# Patient Record
Sex: Male | Born: 1945 | Race: White | Hispanic: No | Marital: Single | Smoking: Former smoker
Health system: Southern US, Community
[De-identification: ages and names within clinical notes are randomized; demographics above are authoritative.]

## PROBLEM LIST (undated history)

## (undated) DIAGNOSIS — M25551 Pain in right hip: Secondary | ICD-10-CM

## (undated) DIAGNOSIS — G40909 Epilepsy, unspecified, not intractable, without status epilepticus: Secondary | ICD-10-CM

## (undated) DIAGNOSIS — R413 Other amnesia: Secondary | ICD-10-CM

## (undated) DIAGNOSIS — D619 Aplastic anemia, unspecified: Secondary | ICD-10-CM

## (undated) DIAGNOSIS — R251 Tremor, unspecified: Secondary | ICD-10-CM

## (undated) HISTORY — DX: Tremor, unspecified: R25.1

## (undated) HISTORY — DX: Other amnesia: R41.3

## (undated) HISTORY — DX: Pain in right hip: M25.551

## (undated) HISTORY — DX: Epilepsy, unspecified, not intractable, without status epilepticus: G40.909

## (undated) HISTORY — DX: Aplastic anemia, unspecified: D61.9

## (undated) HISTORY — PX: CRANIOTOMY: SHX93

## (undated) HISTORY — PX: CATARACT EXTRACTION: SUR2

---

## 1998-01-23 ENCOUNTER — Emergency Department (HOSPITAL_COMMUNITY): Admission: EM | Admit: 1998-01-23 | Discharge: 1998-01-23 | Payer: Self-pay | Admitting: Emergency Medicine

## 1998-01-23 ENCOUNTER — Encounter: Payer: Self-pay | Admitting: Emergency Medicine

## 2003-06-04 ENCOUNTER — Ambulatory Visit (HOSPITAL_COMMUNITY): Admission: RE | Admit: 2003-06-04 | Discharge: 2003-06-04 | Payer: Self-pay | Admitting: Family Medicine

## 2003-10-22 ENCOUNTER — Ambulatory Visit: Payer: Self-pay | Admitting: *Deleted

## 2003-12-23 ENCOUNTER — Ambulatory Visit (HOSPITAL_COMMUNITY): Admission: RE | Admit: 2003-12-23 | Discharge: 2003-12-23 | Payer: Self-pay | Admitting: Family Medicine

## 2003-12-23 ENCOUNTER — Ambulatory Visit: Payer: Self-pay | Admitting: Internal Medicine

## 2003-12-24 ENCOUNTER — Ambulatory Visit: Payer: Self-pay | Admitting: Family Medicine

## 2004-05-19 ENCOUNTER — Ambulatory Visit: Payer: Self-pay | Admitting: Family Medicine

## 2005-02-15 ENCOUNTER — Ambulatory Visit (HOSPITAL_COMMUNITY): Admission: RE | Admit: 2005-02-15 | Discharge: 2005-02-15 | Payer: Self-pay | Admitting: Neurology

## 2006-02-03 IMAGING — CT CT HEAD W/O CM
1 series · 15 of 30 positions shown, 19 images · non-contrast
Comparison: None.

CLINICAL DATA: 58-year-old male, fall, seizure activity, scalp hematoma on the right.  
CT HEAD WITHOUT CONTRAST, 12/23/03:
TECHNIQUE: Noncontrast axial head CT was performed. 
Diffuse advanced cerebral and cerebellar atrophy with prominence of the CSF spaces and the sulcal pattern is noted.  Mild ventricular enlargement.  No definite acute sulcal effacement, mass effect, edema, hemorrhage, herniation, hydrocephalus, midline shift, or extra-axial fluid collection.  The ventricles are symmetric.  The cisterns are patent.  Right frontal orbital soft tissue swelling is noted.  No underlying fracture.  The visualized nasal bones are intact.  Right maxillary sinus rounded soft tissue mucosal thickening is noted consistent with retention cyst or polyp measuring 2 cm.  Remainder of the mastoids and sinuses visualized are clear.

[Series 2: brain · axial · 0.47mm/px · z∈[+101,+296]mm · 15 of 46 slices shown, 19 images]
[im 2/46  brain]
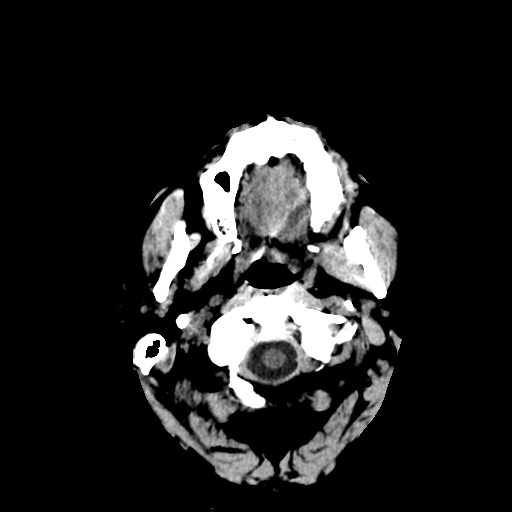
[im 2/46  bone]
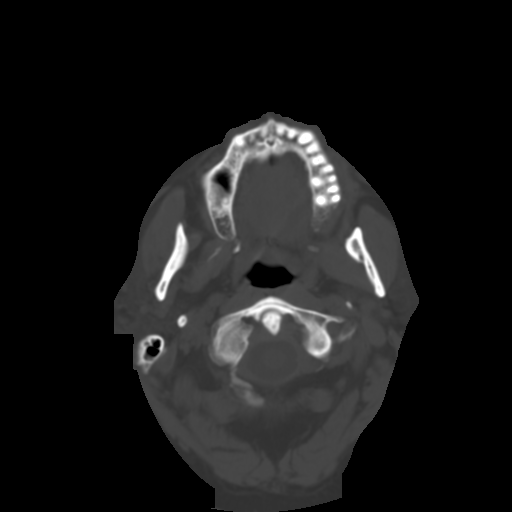
[im 5/46  brain]
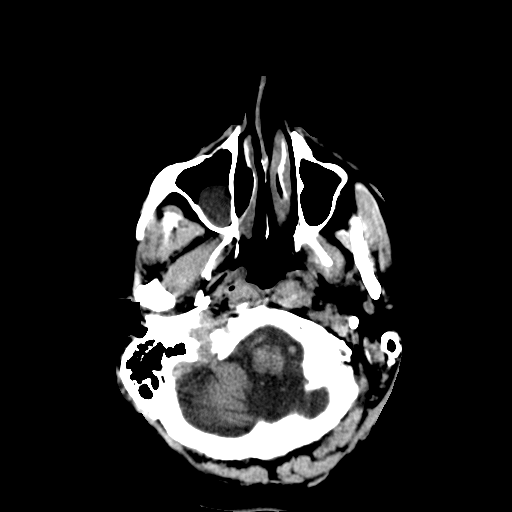
[im 8/46  brain]
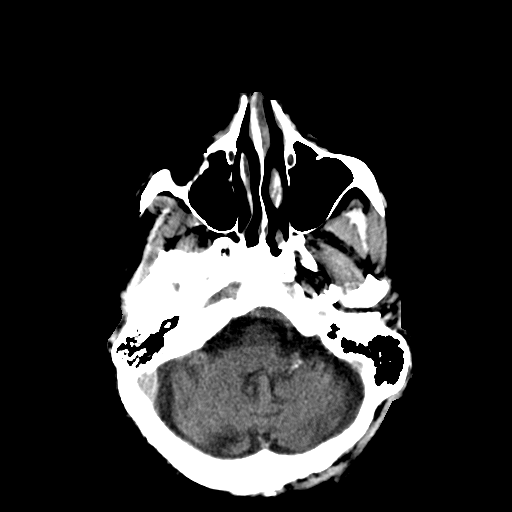
[im 11/46  brain]
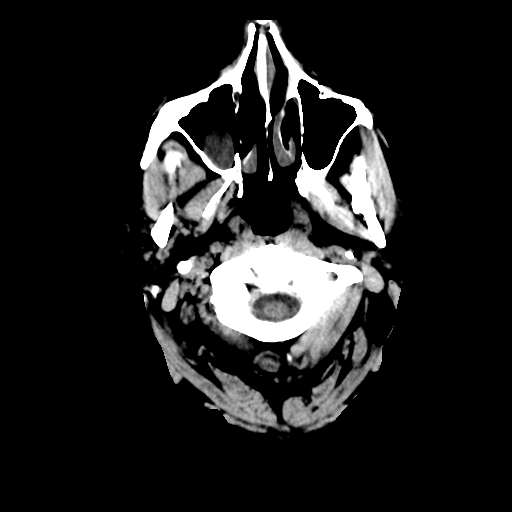
[im 14/46  brain]
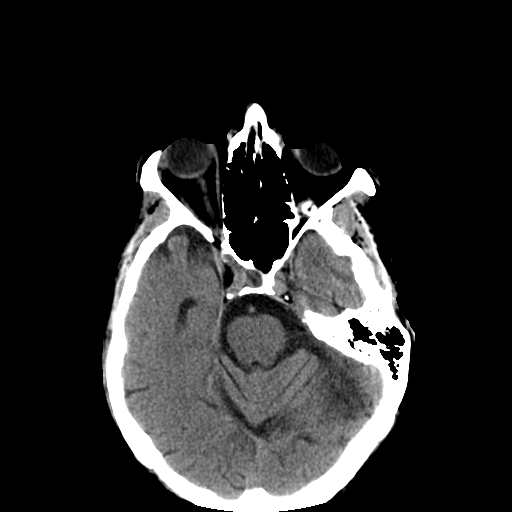
[im 14/46  bone]
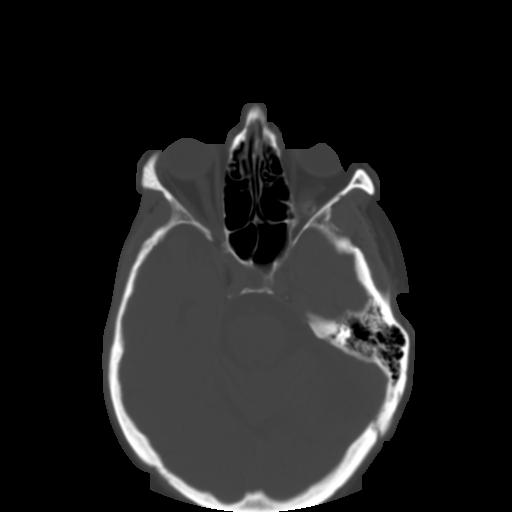
[im 18/46  brain]
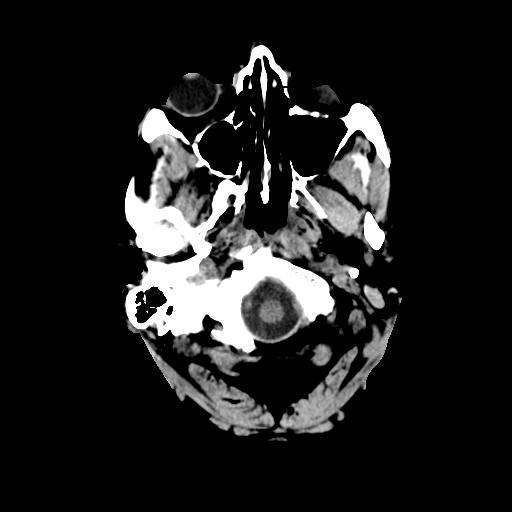
[im 21/46  brain]
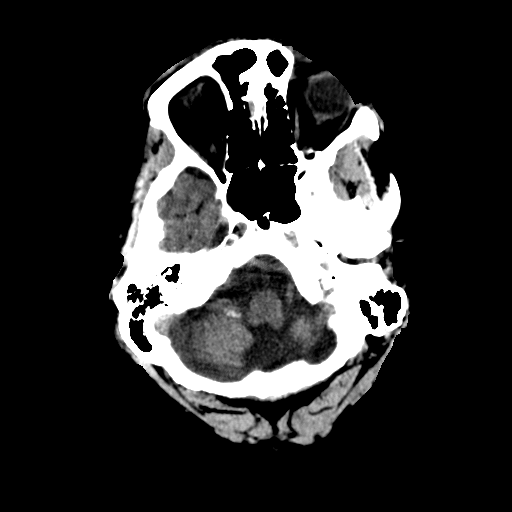
[im 24/46  brain]
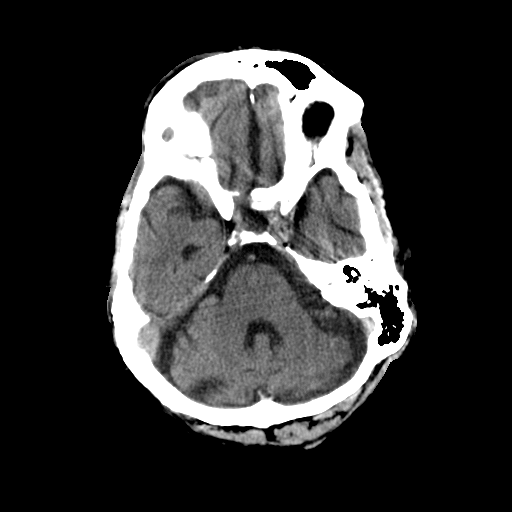
[im 25/46  brain]
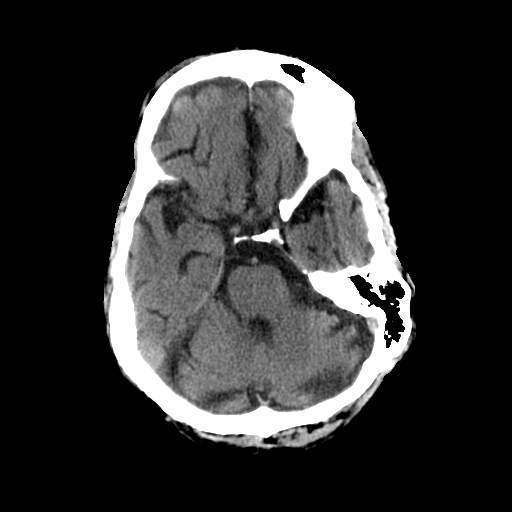
[im 25/46  bone]
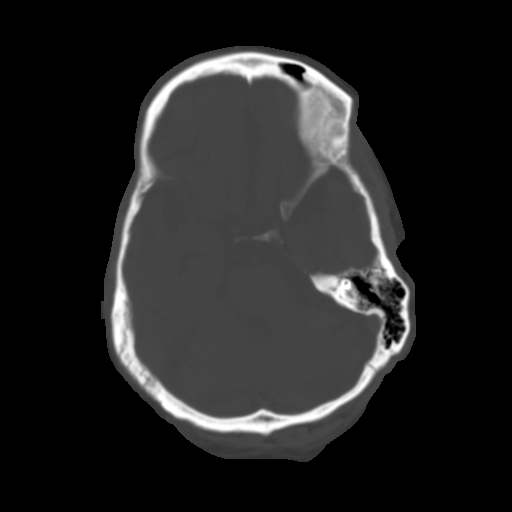
[im 28/46  brain]
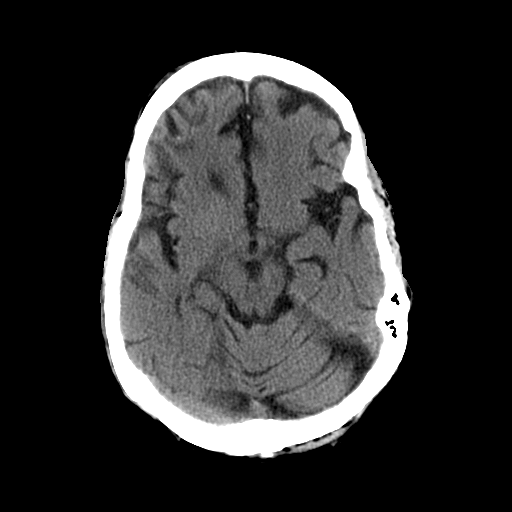
[im 32/46  brain]
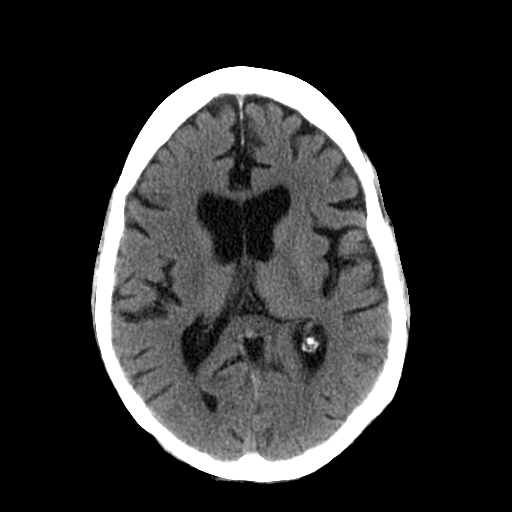
[im 35/46  brain]
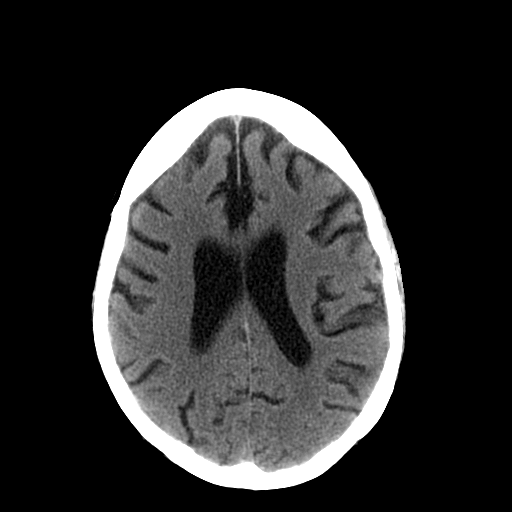
[im 38/46  brain]
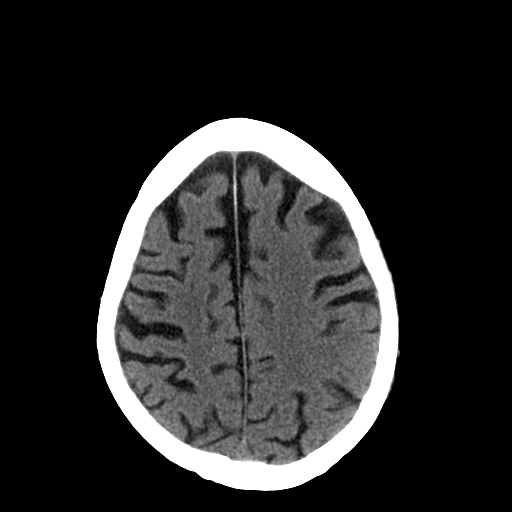
[im 38/46  bone]
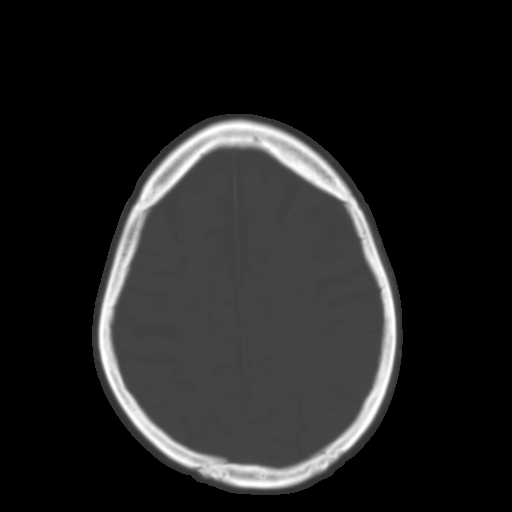
[im 41/46  brain]
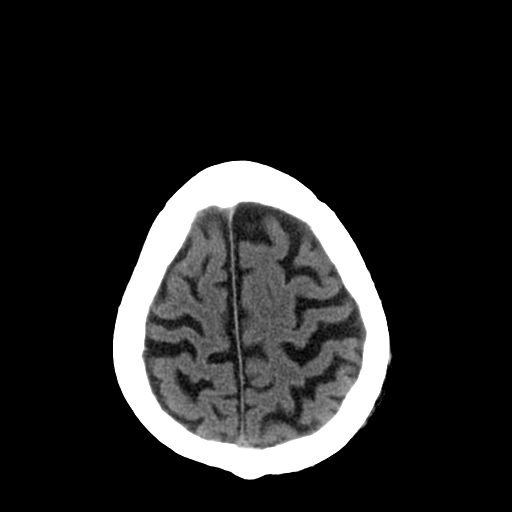
[im 44/46  brain]
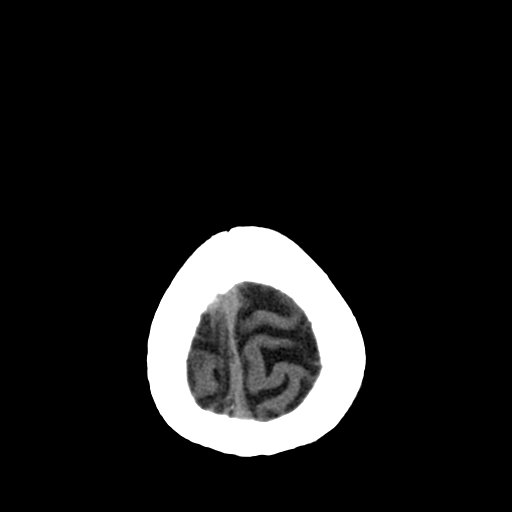

[15 of 30 positions shown; findings below may reference images not displayed]

IMPRESSION: 1. Advanced diffuse brain atrophy.  No acute intracranial hemorrhage or mass effect.  
2. Right frontal orbital soft tissue bruising and swelling.  No underlying fracture.  
3. Right maxillary sinus retention cyst versus polyp.

## 2007-03-30 IMAGING — CT CT HEAD W/O CM
1 of 2 series · 13 of 30 positions shown, 17 images · IV contrast (agent unspecified)
Comparison: 12/23/2003.

CLINICAL DATA: Increasing seizure activity and confusion.  
 HEAD CT WITHOUT CONTRAST:
TECHNIQUE: Contiguous axial images were obtained from the base of the skull through the vertex according to standard protocol without contrast.

[Series 2: brain · axial · 0.47mm/px · z∈[+173,+305]mm · 13 of 28 slices shown, 17 images]
[im 2/28  brain]
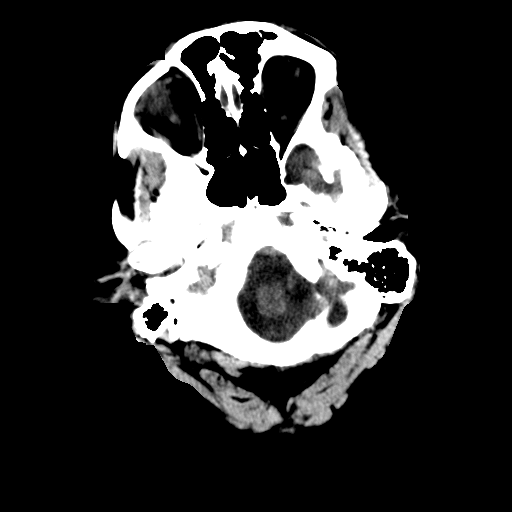
[im 2/28  bone]
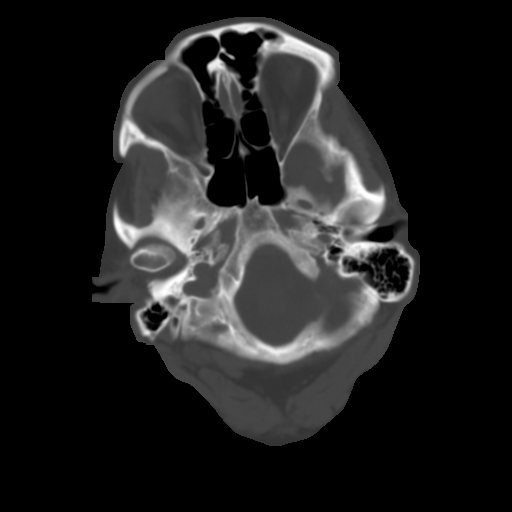
[im 4/28  brain]
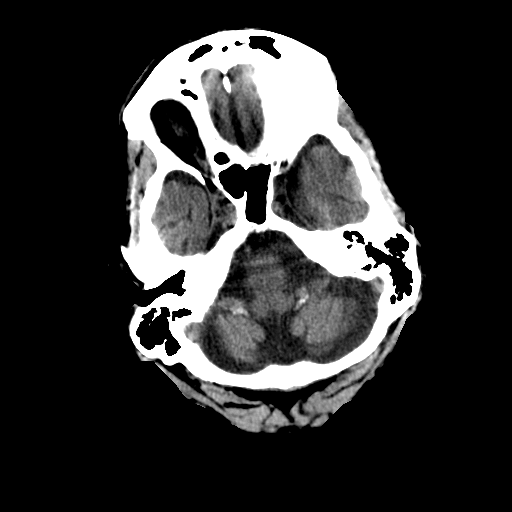
[im 6/28  brain]
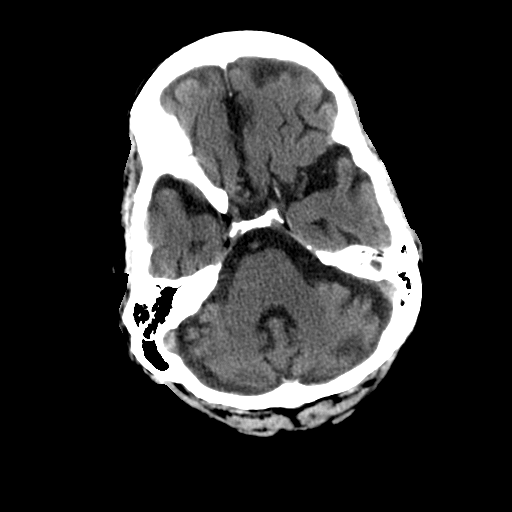
[im 8/28  brain]
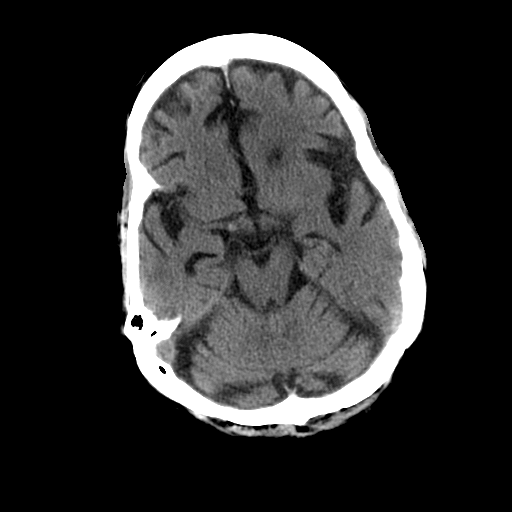
[im 10/28  brain]
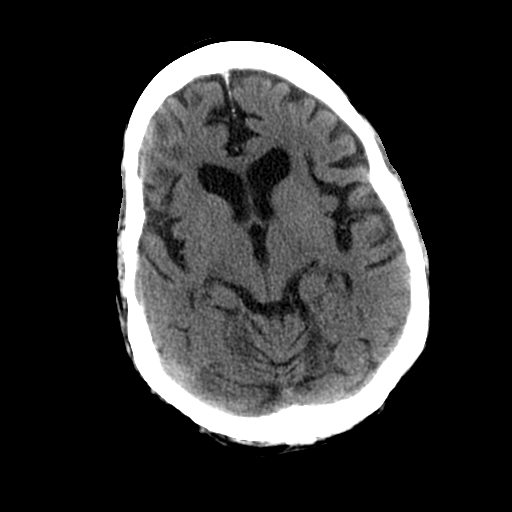
[im 10/28  bone]
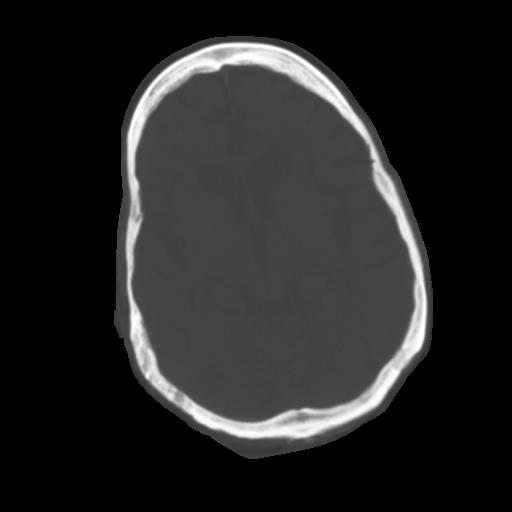
[im 12/28  brain]
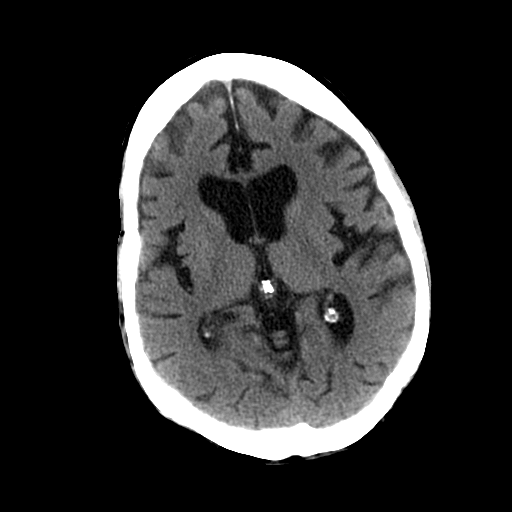
[im 14/28  brain]
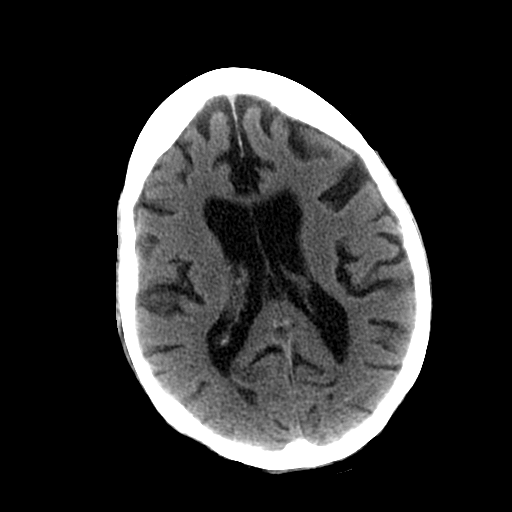
[im 16/28  brain]
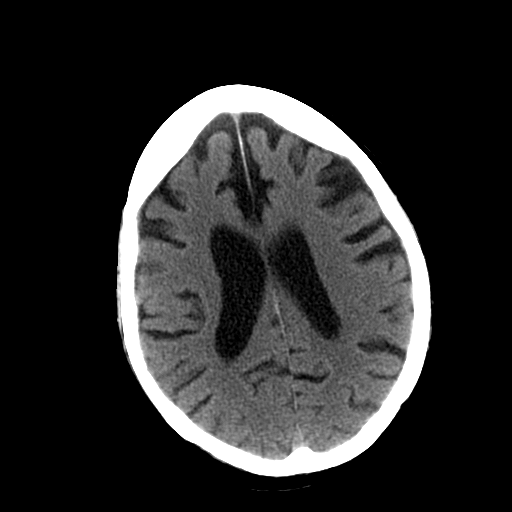
[im 18/28  brain]
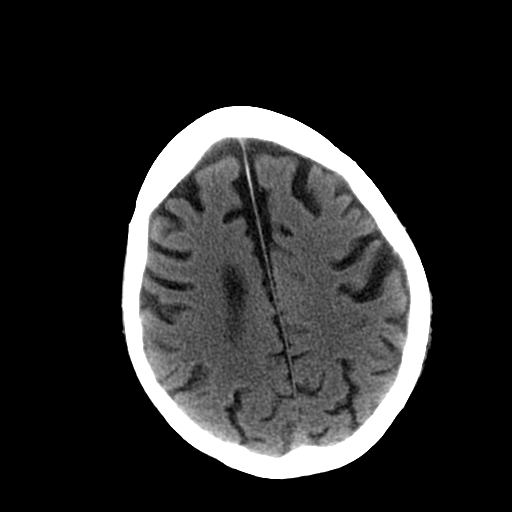
[im 18/28  bone]
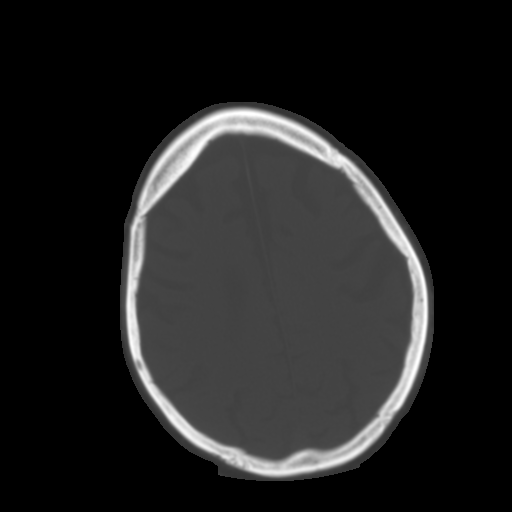
[im 20/28  brain]
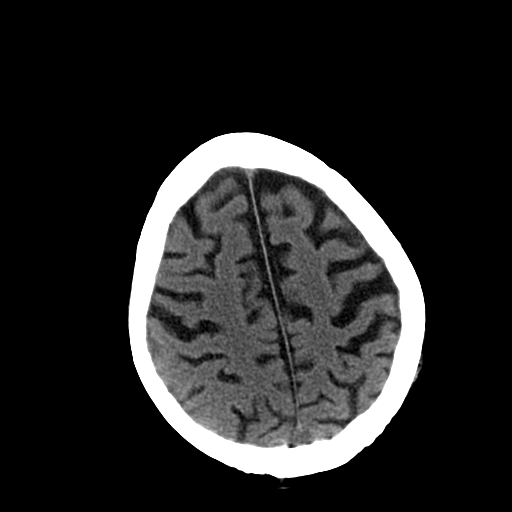
[im 22/28  brain]
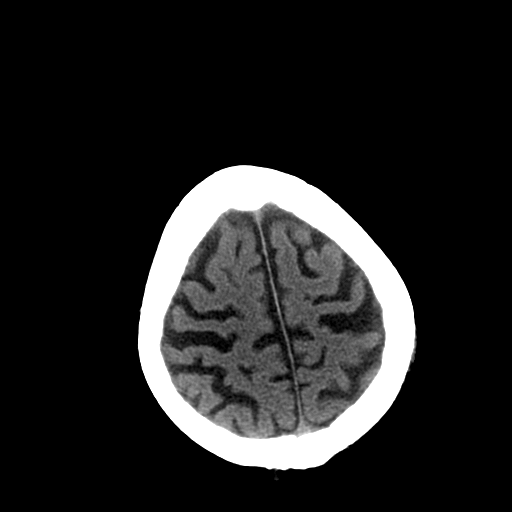
[im 24/28  brain]
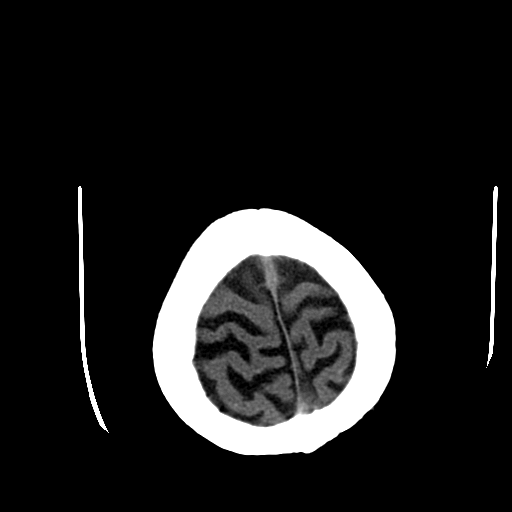
[im 26/28  brain]
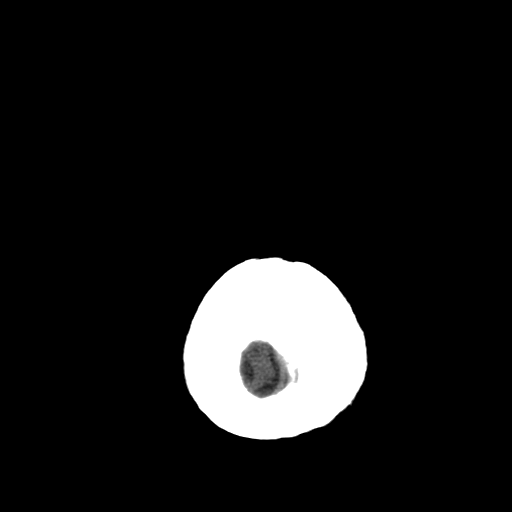
[im 26/28  bone]
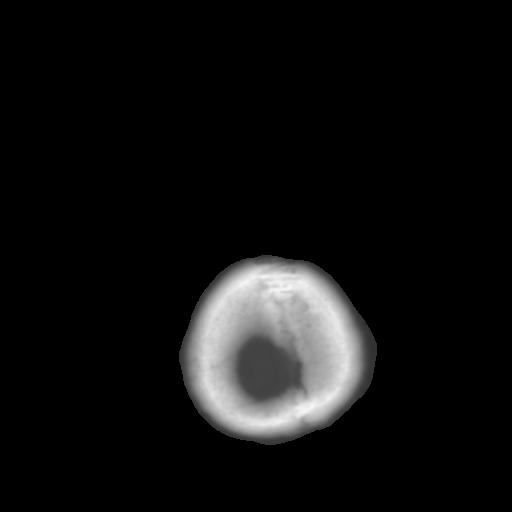

[13 of 30 positions shown; findings below may reference images not displayed]

FINDINGS: Advanced atrophy for age.  No significant small vessel ischemic change.  No definite acute infarct or bleed. Calvarium intact.
IMPRESSION: Advanced atrophy for age ? no acute or focal abnormality.

## 2012-06-04 ENCOUNTER — Ambulatory Visit (INDEPENDENT_AMBULATORY_CARE_PROVIDER_SITE_OTHER): Payer: Medicare Other | Admitting: Neurology

## 2012-06-04 ENCOUNTER — Encounter: Payer: Self-pay | Admitting: Neurology

## 2012-06-04 VITALS — BP 116/78 | HR 76 | Temp 98.0°F | Ht 66.0 in | Wt 186.0 lb

## 2012-06-04 DIAGNOSIS — M25551 Pain in right hip: Secondary | ICD-10-CM

## 2012-06-04 DIAGNOSIS — G40909 Epilepsy, unspecified, not intractable, without status epilepticus: Secondary | ICD-10-CM | POA: Insufficient documentation

## 2012-06-04 DIAGNOSIS — R413 Other amnesia: Secondary | ICD-10-CM

## 2012-06-04 DIAGNOSIS — M25559 Pain in unspecified hip: Secondary | ICD-10-CM

## 2012-06-04 HISTORY — DX: Epilepsy, unspecified, not intractable, without status epilepticus: G40.909

## 2012-06-04 HISTORY — DX: Other amnesia: R41.3

## 2012-06-04 HISTORY — DX: Pain in right hip: M25.551

## 2012-06-04 NOTE — Progress Notes (Signed)
Subjective:    Patient ID: Godson Pollan is a 67 y.o. male.  HPI Interim history:   [name] is a 67 year old right-handed gentleman who presents for followup consultation of his memory loss and seizures. He is accompanied by his sister today. He is a former patient of Dr. Fayrene Fearing love and has been seeing him for several years. He was last seen by him on 03/05/2012, at which time Dr. love did blood work and a urinalysis for recurrent confusional state. The patient's fall assessment total score at that time was 21. 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 He's currently on Depakote 250 mg tid, desmopressin 0.2 mg 3 tablets at night, Synthroid 100 mcg daily, Avodart, Vimpat 150 mg twice daily, Contigen, Robitussin, Celexa generic 10 mg daily, baby aspirin, Fosamax once weekly. According to his sister, the last witnessed seizure was in 8/10. After he has been on Vimpat, he has done well. He fell and hit his head in 2004 and has had problems with his memory. Paternal aunt had Sz. He had a "difficult delivery". Sz started at age 41. He has R hip pain and needs a R hip replacement, but needs to stop smoking first. He has had LE swelling for about 3 years. He has had echo, LE Doppler and EKG last year, per sister. He has been wearing LE compression hoses.   I reviewed Dr. Imagene Gurney prior notes and the patient's records and below is a summary of that review:  67 yo RH gentleman who resides at [facility] in [place]. 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 He was started on vimpat in January 2010 but had side effects including sleepiness. He has a history of aplastic anemia. In August 2012 his MMSE was 26, clock drawing was 4, animal fluency was 7. In  January last year he slipped off his bed and was noted to be confused and may have had a seizure. In January of this year he was found having fallen there has been and was suspected to have had a seizure at the time. He was confused. In January 2014 his MMSE was 20, clock drawing was 4, animal fluency was 5.   His Past Medical History Is Significant For: Past Medical History  Diagnosis Date  . Aplastic anemia   . Tremor   . Epilepsy     His Past Surgical History Is Significant For: Past Surgical History  Procedure Laterality Date  . Craniotomy Right     frontal  . Cataract extraction      His Family History Is Significant For: History reviewed. No pertinent family history.  His Social History Is Significant For: History   Social History  . Marital Status: Single    Spouse Name: N/A    Number of Children: N/A  . Years of Education: N/A   Social History Main Topics  . Smoking status: Current Every Day Smoker    Types: Cigarettes  . Smokeless tobacco: None  . Alcohol Use: No  . Drug Use: No  . Sexually Active: None   Other Topics Concern  . None   Social History Narrative  . None    His Allergies Are:  Allergies  Allergen Reactions  . Aricept (Donepezil Hcl)   . Hydrocodone   . Ibuprofen   .  Mesantoin (Mephenytoin)   . Tegretol (Carbamazepine)   . Topamax (Topiramate)   . Zonegran (Zonisamide)   :   His Current Medications Are:  Outpatient Encounter Prescriptions as of 06/04/2012  Medication Sig Dispense Refill  . alendronate (FOSAMAX) 70 MG tablet       . AVODART 0.5 MG capsule       . citalopram (CELEXA) 10 MG tablet       . COMBIGAN 0.2-0.5 % ophthalmic solution       . desmopressin (DDAVP) 0.2 MG tablet       . divalproex (DEPAKOTE) 250 MG DR tablet       . latanoprost (XALATAN) 0.005 % ophthalmic solution       . levothyroxine (SYNTHROID, LEVOTHROID) 100 MCG tablet       . loperamide (IMODIUM) 2 MG capsule       . nystatin cream (MYCOSTATIN)        . tamsulosin (FLOMAX) 0.4 MG CAPS       . VIMPAT 150 MG TABS        No facility-administered encounter medications on file as of 06/04/2012.  :  Review of Systems  Constitutional: Positive for fatigue.  Respiratory:       Snoring  Gastrointestinal: Positive for constipation.  Musculoskeletal: Positive for myalgias and arthralgias.  Skin: Positive for rash.  Neurological: Positive for dizziness, syncope and speech difficulty.       Memory loss  Psychiatric/Behavioral: Positive for confusion.       Too much sleep    Objective:  Neurologic Exam  Physical Exam Physical Examination:   Filed Vitals:   06/04/12 1201  BP: 116/78  Pulse: 76  Temp: 98 F (36.7 C)    General Examination: The patient is a very pleasant 67 y.o. male in no acute distress.  HEENT: Normocephalic, atraumatic, pupils are equal, round and reactive to light and accommodation. Funduscopic exam is normal with sharp disc margins noted. Extraocular tracking is good without nystagmus noted. Normal smooth pursuit is noted. Hearing is grossly intact. Tympanic membranes are clear bilaterally. Face is symmetric with normal facial animation and normal facial sensation. Speech is clear with no dysarthria noted. There is no hypophonia. There is no lip, neck or jaw tremor. Neck is supple with full range of motion. There are no carotid bruits on auscultation. Oropharynx exam reveals normal findings. No significant airway crowding is noted. Mallampati is class II. Tongue protrudes centrally and palate elevates symmetrically.   Chest: is clear to auscultation without wheezing, rhonchi or crackles noted.  Heart: sounds are regular and normal without murmurs, rubs or gallops noted.   Abdomen: is soft, non-tender and non-distended with normal bowel sounds appreciated on auscultation.  Extremities: There is 2+ pitting edema in the distal lower extremities bilaterally up to the knees and he is wearing compression socks, knee  highs   Skin: is warm and dry with no trophic changes noted.  Musculoskeletal: exam reveals no obvious joint deformities, tenderness or joint swelling or erythema.  Neurologically:  Mental status: The patient is awake, alert and oriented in all 4 spheres. His memory, attention, language and knowledge are fair, his sister provides most of the history. There is no aphasia, agnosia, apraxia or anomia. Speech has mild hesitancy but not dysarthric. Thought process is slightly slowed. Mood is not depressed and affect is normal.  Cranial nerves are as described above under HEENT exam. In addition, shoulder shrug is normal with equal shoulder height noted. Motor exam: Normal bulk, strength  and tone is noted. There is no drift, tremor or rebound. Romberg is negative. Reflexes are 2+ in the UEs and trace in the LEs. Fine motor skills are mildly impaired.  Cerebellar testing shows no dysmetria or intention tremor. There is no truncal or gait ataxia.  Sensory exam is decreased in the LEs.   Gait, station and balance: He stands up with mild difficulty and uses a cane for systems as well. He walks with a limp on the right and reports right hip pain. He's not able to do tandem walk or pull himself up on his heels or toes.               Assessment and Plan:   Assessment and Plan:  In summary, Dewane Timson is a very pleasant 67 y.o.-year old male with a history of Epilepsy since age 26. He is stable in terms of his seizures but has had episodes of confusion, which recently have not recurred. He has had some falls. Part of his gait disorder is related to his right hip pain. He is advised to use his cane at all times. I did not suggest any medication changes today but did suggest followup with Dr. Vickey Huger in 4 month, since she has expertise in epilepsy management. The patient and his sister Alvino Chapel) were in agreement.

## 2012-06-04 NOTE — Patient Instructions (Addendum)
I would like for you to see Dr. Vickey Huger, since she has expertise in epilepsy management.   I do have some generic suggestions for you today:  Please make sure that you drink plenty of fluids. I would like for you to exercise daily for example in the form of walking 20-30 minutes every day, if you can. Please keep a regular sleep-wake schedule, keep regular meal times, do not skip any meals, eat  healthy snacks in between meals, such as fruit or nuts. Try to eat protein with every meal.   Engage in social activities in your community and with your family and try to keep up with current events by reading the newspaper or watching the news.  I do not think we need to make any changes in your medications at this point. I think you're stable enough that we can see you back in 4 months, sooner if we need to. Please call us if you have any interim questions, concerns, or problems or updates to need to discuss.   Brett Canales is my clinical assistant and will answer any of your questions and relay your messages to me and will give you my messages.   Our phone number is [phone]. We also have an after hours call service for urgent matters and there is a physician on-call for urgent questions. For any emergencies you know to call 911 or go to the nearest emergency room.

## 2012-09-19 ENCOUNTER — Encounter: Payer: Self-pay | Admitting: Neurology

## 2012-09-19 ENCOUNTER — Ambulatory Visit (INDEPENDENT_AMBULATORY_CARE_PROVIDER_SITE_OTHER): Payer: Medicare Other | Admitting: Neurology

## 2012-09-19 VITALS — BP 118/73 | HR 77 | Resp 17 | Ht 66.0 in | Wt 180.0 lb

## 2012-09-19 DIAGNOSIS — Q282 Arteriovenous malformation of cerebral vessels: Secondary | ICD-10-CM

## 2012-09-19 DIAGNOSIS — Q283 Other malformations of cerebral vessels: Secondary | ICD-10-CM

## 2012-09-19 DIAGNOSIS — G40802 Other epilepsy, not intractable, without status epilepticus: Secondary | ICD-10-CM

## 2012-09-19 MED ORDER — DIVALPROEX SODIUM 250 MG PO DR TAB: 125.0000 mg | DELAYED_RELEASE_TABLET | Freq: Two times a day (BID) | ORAL | Status: DC

## 2012-09-19 MED ORDER — LACOSAMIDE 150 MG PO TABS: 150.0000 mg | ORAL_TABLET | Freq: Two times a day (BID) | ORAL | Status: DC

## 2012-09-19 NOTE — Progress Notes (Signed)
[facility]  Provider:  Melvyn Novas, M D  Referring Provider: Florentina Jenny, MD Primary Care Physician:  Florentina Jenny, MD  Chief Complaint  Patient presents with  . Follow-up    transfer of care(Love),per Dr Frances Furbish, dementia, rm 10    HPI:  Jordan Thomas is a 67 y.o. male  Is seen here as a referral/ revisit  from [facility] - and has been seen once by Dr Frances Furbish here at [facility] .  Following below is Dr Teofilo Pod note from April 2014.  He's currently on Depakote 250 mg tid, desmopressin 0.2 mg 3 tablets at night, Synthroid 100 mcg daily, Avodart, Vimpat 150 mg twice daily, Contigen, Robitussin, Celexa generic 10 mg daily, baby aspirin, Fosamax once weekly. According to his sister, the last witnessed seizure was in 8/10. After he has been on Vimpat, he has done well. He fell and hit his head in 2004 and has had problems with his memory. Paternal aunt had Sz. He had a "difficult delivery". Sz started at age 46. He has R hip pain and needs a R hip replacement, but needs to stop smoking first. He has had LE swelling for about 3 years.  He has had Echo, leg- Doppler and EKG last year, per sister. He has been wearing LE compression hoses.  I reviewed Dr. Imagene Gurney prior notes and the patient's records and below is a summary of that review:  67 yo RH gentleman who resides at [facility] in [place]. 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 He was started on vimpat in January 2010 but had side effects including sleepiness. He has a history of aplastic anemia.  In August 2012 his MMSE was 26, clock drawing was 4, animal fluency was 7.  In January last year he slipped off his bed and was noted to be confused and may have had a seizure- he was found having fallen ,  suspected to have had a seizure at the time.   In January 2014 his MMSE was 20, clock drawing was 4, animal fluency was 5.   Today we are  refilling medications, as he is not in need of adjustments.       Review of Systems: Out of a complete 14 system review, the patient complains of only the following symptoms, and all other reviewed systems are negative.   History   Social History  . Marital Status: Single    Spouse Name: N/A    Number of Children: N/A  . Years of Education: college   Occupational History  . waiter     resides in [address]   Social History Main Topics  . Smoking status: Current Every Day Smoker    Types: Cigarettes  . Smokeless tobacco: Not on file  . Alcohol Use: No  . Drug Use: No  . Sexually Active: Not on file   Other Topics Concern  . Not on file   Social History Narrative  . No narrative on file    Family History  Problem Relation Age of Onset  . Parkinson's disease Mother     versus lewy body dementia  . Stroke Maternal Grandmother   . Heart disease Maternal Grandmother   . Stroke Maternal Grandfather   . Heart disease Maternal Grandfather   . Stroke Paternal Grandmother   . Heart disease Paternal Grandmother   . Stroke Paternal Grandfather   . Heart disease Paternal Grandfather   .  Diabetes Other   . Seizures Other     grand mal  . Mental retardation Cousin     Past Medical History  Diagnosis Date  . Aplastic anemia   . Tremor   . Epilepsy   . Epilepsy without status epilepticus, not intractable 06/04/2012  . Memory loss 06/04/2012  . Right hip pain 06/04/2012    Past Surgical History  Procedure Laterality Date  . Craniotomy Right     frontal  . Cataract extraction      Current Outpatient Prescriptions  Medication Sig Dispense Refill  . alendronate (FOSAMAX) 70 MG tablet       . AVODART 0.5 MG capsule       . citalopram (CELEXA) 10 MG tablet       . COMBIGAN 0.2-0.5 % ophthalmic solution       . desmopressin (DDAVP) 0.2 MG tablet       . divalproex (DEPAKOTE) 250 MG DR tablet       . finasteride (PROSCAR) 5 MG tablet       . latanoprost  (XALATAN) 0.005 % ophthalmic solution       . levothyroxine (SYNTHROID, LEVOTHROID) 100 MCG tablet       . loperamide (IMODIUM) 2 MG capsule       . nystatin cream (MYCOSTATIN)       . omeprazole (PRILOSEC) 20 MG capsule       . ondansetron (ZOFRAN) 4 MG tablet       . tamsulosin (FLOMAX) 0.4 MG CAPS       . VIMPAT 150 MG TABS        No current facility-administered medications for this visit.    Allergies as of 09/19/2012 - Review Complete 09/19/2012  Allergen Reaction Noted  . Aricept (donepezil hcl)  06/04/2012  . Hydrocodone  06/04/2012  . Ibuprofen  06/04/2012  . Mesantoin (mephenytoin)  06/04/2012  . Tegretol (carbamazepine)  06/04/2012  . Topamax (topiramate)  06/04/2012  . Zonegran (zonisamide)  06/04/2012    Vitals: BP 118/73  Pulse 77  Ht 5\' 6"  (1.676 m)  Wt 180 lb (81.647 kg)  BMI 29.07 kg/m2 Last Weight:  Wt Readings from Last 1 Encounters:  09/19/12 180 lb (81.647 kg)   Last Height:   Ht Readings from Last 1 Encounters:  09/19/12 5\' 6"  (1.676 m)   General Examination: The patient is a very pleasant 67 y.o. male in no acute distress.  HEENT: Normocephalic, atraumatic, left eye is wider open,  pupils are equal, round and reactive to light and accommodation. Funduscopic exam - glaucoma,    Extraocular tracking is good with a mild  Nystagmus, bobbing -  and normal smooth pursuit, he had cataract surgery.    Hearing is grossly intact.   Face is symmetric with normal facial animation and normal facial sensation. Speech is clear, no dysarthria nor dysphonia  noted. There is no hypophonia.  There is no lip, neck or jaw tremor. Neck is supple with full range of motion.  There are no carotid bruits on auscultation. Oropharynx exam reveals normal findings. No significant airway crowding is noted. Mallampati is class II. Tongue protrudes centrally and palate elevates symmetrically.  Chest: is clear to auscultation without wheezing, rhonchi or crackles noted.  Heart:  sounds are regular and normal without murmurs, rubs or gallops noted.  Abdomen: is soft, non-tender and non-distended with normal bowel sounds appreciated on auscultation.  Extremities: There is 2+ pitting edema in the distal lower extremities bilaterally up to the  knees and he is wearing compression socks, knee highs  Skin: is warm and dry with no trophic changes noted. He wears compression stockings for chronic ankle edema.   Neurologically:  Mental status: The patient is awake, alert and oriented in all 4 spheres. His memory, attention, language and knowledge are fair, his sister provides most of the history.  His MMSE revealed 27 -30 points, he had mostly problems with reading and visual spatial tasks. His AVM was on the right side.   There is no aphasia, agnosia, apraxia or anomia. Speech has mild hesitancy but not dysarthric. Thought process is slightly slowed. Mood is not depressed and affect is normal. He is friendly and cooperative.  Cranial nerves are as described . In addition, shoulder shrug is normal with equal shoulder height noted.   Motor exam: Normal bulk, strength and tone is noted. There is no drift, tremor or rebound.  Romberg is negative. Reflexes are 2+ in the UEs and trace in the LEs. Fine motor skills are mildly impaired.  Cerebellar testing shows no dysmetria or intention tremor.  There is no truncal or gait ataxia.  Sensory exam is decreased  To fine filament and vibration in the toes of either foot.    Gait, station and balance:   He stands up with mild difficulty and uses a cane for systems as well. He walks with a limp on the right and reports right hip pain.  He is status post hip replacement and now uses a cane - he is in PT actively.  He's not able to do tandem walk or pull himself up on his heels or toes.   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  Finally he found good seizure control for about 3 years on VIMPAT. This medication made the greatest difference.    Plan:  Treatment plan and additional workup : patient stopped smoking last May and underwent  Hip replacement on the right.  His seizures are controlled and he will remain under th same medication regimen. VIMPAT and DEPAKOTE. Rv once yearly .

## 2012-09-19 NOTE — Patient Instructions (Signed)
Epilepsy People with epilepsy have times when they shake and jerk uncontrollably (seizures). This happens when there is a sudden change in brain function. Epilepsy may have many possible causes. Anything that disturbs the normal pattern of brain cell activity can lead to seizures. HOME CARE   Listen to your doctor about driving and safety during normal activities.  Only take medicine as told by your doctor.  Take blood tests as told by your doctor.  Tell the people you live and work with that you have seizures. Make sure they know how to help you. They should:  Cushion your head and body.  Turn you on your side.  Not restrain you.  Not place anything inside your mouth.  Call for local emergency medical help if there is any question about what has happened.  Write down when your seizures happen and what you remember about each seizure. Write down anything you think may have caused the seizure to happen (trigger).  Keep all follow-up visits with your doctor. This is very important. GET HELP RIGHT AWAY IF:   You get an infection or start to feel sick. You may have more seizures when you are sick.  You are having seizures more often.  Your seizure pattern is changing.  A seizure does not stop after a few seconds or minutes.  A seizure causes you to have trouble breathing.  A seizure gives you a very bad headache.  A seizure makes you unable to speak or use a part of your body. MAKE SURE YOU:   Understand these instructions.  Will watch your condition.  Will get help right away if you are not doing well or get worse. Document Released: 11/28/2008 Document Revised: 04/25/2011 Document Reviewed: 11/28/2008 [facility] Patient Information 2014 [place], [place].

## 2012-09-26 ENCOUNTER — Telehealth: Payer: Self-pay | Admitting: *Deleted

## 2012-09-26 NOTE — Telephone Encounter (Addendum)
Calling re: clarification on depakote.  Will have to consult with Dr. Vickey Huger who saw pt last. Will call her back [phone], Tasha.   I called and spoke to daughter of pt, Jordan Thomas.  She stated that pt on depakote 250mg  po tid.  Changed to BID per Dr. Vickey Huger but same dose.  Need clarification.

## 2012-09-27 NOTE — Telephone Encounter (Signed)
I called Jordan Thomas, with [facility], (JoyNP). Re: Depakote dosage after Dr. Vickey Huger seeing lab ammonia 140.  Chrisitina took the new ordr of Depakote 250mg  po bid.  She would make the change.

## 2013-09-18 ENCOUNTER — Ambulatory Visit: Payer: Medicare Other | Admitting: Neurology

## 2013-10-15 ENCOUNTER — Ambulatory Visit: Payer: Medicare Other | Admitting: Neurology

## 2013-10-22 ENCOUNTER — Ambulatory Visit (INDEPENDENT_AMBULATORY_CARE_PROVIDER_SITE_OTHER): Payer: Medicare Other | Admitting: Neurology

## 2013-10-22 ENCOUNTER — Encounter: Payer: Self-pay | Admitting: Neurology

## 2013-10-22 VITALS — BP 129/78 | HR 105 | Resp 17 | Ht 66.25 in | Wt 198.0 lb

## 2013-10-22 DIAGNOSIS — Q282 Arteriovenous malformation of cerebral vessels: Secondary | ICD-10-CM

## 2013-10-22 DIAGNOSIS — I639 Cerebral infarction, unspecified: Secondary | ICD-10-CM

## 2013-10-22 DIAGNOSIS — Q283 Other malformations of cerebral vessels: Secondary | ICD-10-CM

## 2013-10-22 DIAGNOSIS — I6350 Cerebral infarction due to unspecified occlusion or stenosis of unspecified cerebral artery: Secondary | ICD-10-CM

## 2013-10-22 DIAGNOSIS — G40802 Other epilepsy, not intractable, without status epilepticus: Secondary | ICD-10-CM

## 2013-10-22 MED ORDER — DIVALPROEX SODIUM 250 MG PO DR TAB: 125.0000 mg | DELAYED_RELEASE_TABLET | Freq: Two times a day (BID) | ORAL | Status: DC

## 2013-10-22 MED ORDER — LACOSAMIDE 150 MG PO TABS: 150.0000 mg | ORAL_TABLET | Freq: Two times a day (BID) | ORAL | Status: DC

## 2013-10-22 NOTE — Progress Notes (Addendum)
[facility]  Provider:  Larey Seat, M D  Referring Provider: Reymundo Poll, MD Primary Care Physician:  Dortha Kern  Chief Complaint  Patient presents with  . Follow-up    Room 11  . Seizures    HPI:  Jordan Thomas is a 68 y.o. Thomas , who is seen here as a  revisit  from [facility] - and has been seen by Dr Rexene Alberts here at [facility] in 2014- transferred care .   Patient underwent hip replacement surgery in august 2014, since than his sister has noted his memory to have improved. He was no longer in pain. He has a room mate in the home who has not been allowing him to sleep. This room mate is unkempt and smelly, and noisy. He lives at [facility] in [place].  The patient is more mobile and much more alert. He reports no enuresis. Last seizure " 2010 or 2012"  250 mg tid po. Depokote. Vimpat 150 mg bid po since 2010. CMET and CBC today.           Following below is Dr Guadelupe Sabin note from April 2014. He's currently on Depakote 250 mg tid, desmopressin 0.2 mg 3 tablets at night, Synthroid 100 mcg daily, Avodart, Vimpat 150 mg twice daily, Contigen, Robitussin, Celexa generic 10 mg daily, baby aspirin, Fosamax once weekly. According to his sister, the last witnessed seizure was in 8/10. After he has been on Vimpat, he has done well. He fell and hit his head in 2004 and has had problems with his memory. Paternal aunt had Sz. He had a "difficult delivery".  Sz started at age 89. He has R hip pain and needs a R hip replacement, but needs to stop smoking first. He has had LE swelling for about 3 years.  He has had Echo, leg- Doppler and EKG last year, per sister. He has been wearing LE compression hoses.  I reviewed Dr. Tressia Danas prior notes and the patient's records and below is a summary of that review:  68 yo RH gentleman who resides at [facility] in [address].  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  He was started on vimpat in January 2010 but had side effects including sleepiness. He has a history of aplastic anemia.   In August 2012 his MMSE was 26, clock drawing was 4, animal fluency was 7.  In January last year he slipped off his bed in 2012 and was noted to be confused and may have had a seizure- he was found having fallen ,  suspected to have had a seizure at the time.   In January 2014 his MMSE was 20, clock drawing was 4, animal fluency was 5.  There was no seizure activity since 2010, and since being on VIMPAT.        Review of Systems: Out of a complete 14 system review, the patient complains of only the following symptoms, and all other reviewed systems are negative.  MMSE 29-30 , GDS 6 points.  Still smoking.   This seizure patient was found to be on Chantix, a seizure threshold lowering medication.    History   Social History  . Marital Status: Single    Spouse Name: N/A    Number of Children: 0  . Years of Education: college   Occupational History  . waiter     resides in [place] History  Main Topics  . Smoking status: Current Every Day Smoker    Types: Cigarettes  . Smokeless tobacco: Former Systems developer    Quit date: 06/19/2012  . Alcohol Use: No  . Drug Use: No  . Sexual Activity: Not on file   Other Topics Concern  . Not on file   Social History Narrative   Patient is single and lives at Assisted living.   Patient does not have any children.   Patient is retired.   Patient has some college education.   Patient is right-handed.   Patient drinks four cups of coffee daily, one cup of soda daily.    Family History  Problem Relation Age of Onset  . Parkinson's disease Mother     versus lewy body dementia  . Stroke Maternal Grandmother   . Heart disease Maternal Grandmother   . Stroke Maternal Grandfather   . Heart disease Maternal Grandfather   . Stroke Paternal Grandmother   . Heart disease Paternal  Grandmother   . Stroke Paternal Grandfather   . Heart disease Paternal Grandfather   . Diabetes Other   . Seizures Other     grand mal  . Mental retardation Cousin     Past Medical History  Diagnosis Date  . Aplastic anemia   . Tremor   . Epilepsy   . Epilepsy without status epilepticus, not intractable 06/04/2012  . Memory loss 06/04/2012  . Right hip pain 06/04/2012    Past Surgical History  Procedure Laterality Date  . Craniotomy Right     frontal  . Cataract extraction      Current Outpatient Prescriptions  Medication Sig Dispense Refill  . alendronate (FOSAMAX) 70 MG tablet       . Artificial Tear Ointment (ARTIFICIAL TEARS) ointment Place into both eyes 3 (three) times daily.      Marland Kitchen aspirin 81 MG tablet Take 81 mg by mouth daily.      . AVODART 0.5 MG capsule       . betamethasone dipropionate (DIPROLENE) 0.05 % cream Apply topically 2 (two) times daily.      . bimatoprost (LUMIGAN) 0.01 % SOLN Place 1 drop into both eyes at bedtime.      . Calcium Carb-Cholecalciferol (OYSTER SHELL CALCIUM + D PO) Take 1 tablet by mouth daily.      . citalopram (CELEXA) 10 MG tablet       . clotrimazole (LOTRIMIN) 1 % cream Apply 1 application topically 2 (two) times daily. Apply to groin for fungal infection      . COMBIGAN 0.2-0.5 % ophthalmic solution       . desmopressin (DDAVP) 0.2 MG tablet       . divalproex (DEPAKOTE) 250 MG DR tablet Take 1 tablet (250 mg total) by mouth 2 (two) times daily.  90 tablet  11  . docusate sodium (COLACE) 100 MG capsule Take 100 mg by mouth daily.      . finasteride (PROSCAR) 5 MG tablet       . KETOCONAZOLE, TOPICAL, 1 % SHAM Apply topically. Washing hair on Monday and Fridays      . Lacosamide (VIMPAT) 150 MG TABS Take 1 tablet (150 mg total) by mouth 2 (two) times daily.  60 tablet  11  . latanoprost (XALATAN) 0.005 % ophthalmic solution       . levothyroxine (SYNTHROID, LEVOTHROID) 100 MCG tablet       . loperamide (IMODIUM) 2 MG capsule        .  nystatin cream (MYCOSTATIN)       . omeprazole (PRILOSEC) 20 MG capsule       . ondansetron (ZOFRAN) 4 MG tablet       . Skin Protectants, Misc. (EUCERIN) cream Apply topically 2 (two) times daily as needed for dry skin.      . tadalafil (CIALIS) 5 MG tablet Take 5 mg by mouth daily as needed for erectile dysfunction.      . tamsulosin (FLOMAX) 0.4 MG CAPS       . varenicline (CHANTIX) 0.5 MG tablet Take 0.5 mg by mouth 2 (two) times daily.       No current facility-administered medications for this visit.    Allergies as of 10/22/2013 - Review Complete 10/22/2013  Allergen Reaction Noted  . Aricept [donepezil hcl]  06/04/2012  . Hydrocodone  06/04/2012  . Ibuprofen  06/04/2012  . Mesantoin [mephenytoin]  06/04/2012  . Tegretol [carbamazepine]  06/04/2012  . Topamax [topiramate]  06/04/2012  . Zonegran [zonisamide]  06/04/2012    Vitals: BP 129/78  Pulse 105  Resp 17  Ht 5' 6.25" (1.683 m)  Wt 198 lb (89.812 kg)  BMI 31.71 kg/m2 Last Weight:  Wt Readings from Last 1 Encounters:  10/22/13 198 lb (89.812 kg)   Last Height:   Ht Readings from Last 1 Encounters:  10/22/13 5' 6.25" (1.683 m)   General Examination: The patient is a very pleasant 68 y.o. Thomas in no acute distress.  HEENT: Normocephalic, atraumatic, left eye is wider open,  pupils are equal, round and reactive to light and accommodation. Funduscopic exam - glaucoma,    Extraocular tracking is good with a mild  Nystagmus, bobbing -  and normal smooth pursuit, he had cataract surgery.    Hearing is grossly intact.   Face is symmetric with normal facial animation and normal facial sensation. Speech is clear, no dysarthria nor dysphonia  noted. There is no hypophonia.  There is no lip, neck or jaw tremor. Neck is supple with full range of motion.  There are no carotid bruits on auscultation. Oropharynx exam reveals normal findings. No significant airway crowding is noted. Mallampati is class II. Tongue protrudes  centrally and palate elevates symmetrically.  Chest: is clear to auscultation without wheezing, rhonchi or crackles noted.  Heart: sounds are regular and normal without murmurs, rubs or gallops noted.  Abdomen: is soft, non-tender and non-distended with normal bowel sounds appreciated on auscultation.  Extremities: he has a rash over the left ankle . He has no longer edema.  Skin: is warm and dry with trophic changes noted.   Neurologically:  Mental status: The patient is awake, alert and oriented in all 4 spheres. His memory, attention, language and knowledge are fair, his sister provides most of the history.  His MMSE revealed 27 -30 points, he had mostly problems with reading and visual spatial tasks.  His AVM was on the right brain..  There is no aphasia, agnosia, apraxia or anomia. Speech has mild hesitancy but not dysarthric. Thought process is slightly slowed. Mood is not depressed and affect is normal. He is friendly and cooperative.  Cranial nerves are as described . In addition, shoulder shrug is normal with equal shoulder height noted.   Motor exam: Normal bulk, strength and tone is noted. There is no drift, tremor or rebound.  Romberg is negative. Reflexes are 2+ in the UEs and trace in the LEs. Fine motor skills are mildly impaired.  Cerebellar testing shows no dysmetria or intention  tremor.  There is no truncal or gait ataxia.  Sensory exam is decreased to fine filament and vibration in the toes .    He stands up with mild difficulty, not needing to brace  and no longer uses a cane l. He walks fast .  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  He's not able to do tandem walk or pull himself up on his heels or toes.   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    Finally he found good seizure control for about 4.5 years on VIMPAT. This medication made the greatest difference.    Plan:  Treatment plan and additional workup : patient stopped smoking last May and underwent  Hip replacement on the right.  His seizures are controlled and he will remain under th same medication regimen.  VIMPAT and DEPAKOTE.  CBC and diff and CMET.  Recommend d/c of chantix.  Rv once yearly with NP  .

## 2013-10-22 NOTE — Patient Instructions (Signed)

## 2013-10-22 NOTE — Addendum Note (Signed)
Addended by: Larey Seat on: 10/22/2013 03:07 PM   Modules accepted: Orders, Medications

## 2013-10-23 LAB — CBC WITH DIFFERENTIAL
Basophils Absolute: 0.1 10*3/uL (ref 0.0–0.2)
Basos: 1 %
EOS ABS: 0.1 10*3/uL (ref 0.0–0.4)
Eos: 1 %
HCT: 40.6 % (ref 37.5–51.0)
Hemoglobin: 13.5 g/dL (ref 12.6–17.7)
IMMATURE GRANS (ABS): 0 10*3/uL (ref 0.0–0.1)
IMMATURE GRANULOCYTES: 0 %
Lymphocytes Absolute: 2.5 10*3/uL (ref 0.7–3.1)
Lymphs: 32 %
MCH: 29.7 pg (ref 26.6–33.0)
MCHC: 33.3 g/dL (ref 31.5–35.7)
MCV: 89 fL (ref 79–97)
MONOS ABS: 0.8 10*3/uL (ref 0.1–0.9)
Monocytes: 10 %
NEUTROS PCT: 56 %
Neutrophils Absolute: 4.4 10*3/uL (ref 1.4–7.0)
PLATELETS: 239 10*3/uL (ref 150–379)
RBC: 4.54 x10E6/uL (ref 4.14–5.80)
RDW: 15.8 % — ABNORMAL HIGH (ref 12.3–15.4)
WBC: 7.8 10*3/uL (ref 3.4–10.8)

## 2013-10-23 LAB — COMPREHENSIVE METABOLIC PANEL
A/G RATIO: 1.5 (ref 1.1–2.5)
ALK PHOS: 72 IU/L (ref 39–117)
ALT: 41 IU/L (ref 0–44)
AST: 16 IU/L (ref 0–40)
Albumin: 4.3 g/dL (ref 3.6–4.8)
BUN/Creatinine Ratio: 19 (ref 10–22)
BUN: 12 mg/dL (ref 8–27)
CO2: 27 mmol/L (ref 18–29)
CREATININE: 0.64 mg/dL — AB (ref 0.76–1.27)
Calcium: 9.7 mg/dL (ref 8.6–10.2)
Chloride: 99 mmol/L (ref 97–108)
GFR calc Af Amer: 118 mL/min/{1.73_m2} (ref >59)
GFR, EST NON AFRICAN AMERICAN: 102 mL/min/{1.73_m2} (ref >59)
GLOBULIN, TOTAL: 2.9 g/dL (ref 1.5–4.5)
Glucose: 75 mg/dL (ref 65–99)
Potassium: 4.7 mmol/L (ref 3.5–5.2)
SODIUM: 142 mmol/L (ref 134–144)
Total Bilirubin: <0.2 mg/dL (ref 0.0–1.2)
Total Protein: 7.2 g/dL (ref 6.0–8.5)

## 2014-02-20 DIAGNOSIS — E039 Hypothyroidism, unspecified: Secondary | ICD-10-CM | POA: Diagnosis not present

## 2014-02-20 DIAGNOSIS — K5909 Other constipation: Secondary | ICD-10-CM | POA: Diagnosis not present

## 2014-02-20 DIAGNOSIS — F329 Major depressive disorder, single episode, unspecified: Secondary | ICD-10-CM | POA: Diagnosis not present

## 2014-02-20 DIAGNOSIS — M1990 Unspecified osteoarthritis, unspecified site: Secondary | ICD-10-CM | POA: Diagnosis not present

## 2014-02-20 DIAGNOSIS — Z7982 Long term (current) use of aspirin: Secondary | ICD-10-CM | POA: Diagnosis not present

## 2014-02-20 DIAGNOSIS — R42 Dizziness and giddiness: Secondary | ICD-10-CM | POA: Diagnosis not present

## 2014-02-20 DIAGNOSIS — R404 Transient alteration of awareness: Secondary | ICD-10-CM | POA: Diagnosis not present

## 2014-02-20 DIAGNOSIS — R0602 Shortness of breath: Secondary | ICD-10-CM | POA: Diagnosis not present

## 2014-02-20 DIAGNOSIS — R531 Weakness: Secondary | ICD-10-CM | POA: Diagnosis not present

## 2014-02-20 DIAGNOSIS — R110 Nausea: Secondary | ICD-10-CM | POA: Diagnosis not present

## 2014-02-20 DIAGNOSIS — Z792 Long term (current) use of antibiotics: Secondary | ICD-10-CM | POA: Diagnosis not present

## 2014-02-20 DIAGNOSIS — J9811 Atelectasis: Secondary | ICD-10-CM | POA: Diagnosis not present

## 2014-02-22 DIAGNOSIS — N390 Urinary tract infection, site not specified: Secondary | ICD-10-CM | POA: Diagnosis not present

## 2014-02-25 DIAGNOSIS — F0630 Mood disorder due to known physiological condition, unspecified: Secondary | ICD-10-CM | POA: Diagnosis not present

## 2014-02-27 DIAGNOSIS — G40802 Other epilepsy, not intractable, without status epilepticus: Secondary | ICD-10-CM | POA: Diagnosis not present

## 2014-03-04 DIAGNOSIS — L820 Inflamed seborrheic keratosis: Secondary | ICD-10-CM | POA: Diagnosis not present

## 2014-03-04 DIAGNOSIS — L739 Follicular disorder, unspecified: Secondary | ICD-10-CM | POA: Diagnosis not present

## 2014-03-04 DIAGNOSIS — L400 Psoriasis vulgaris: Secondary | ICD-10-CM | POA: Diagnosis not present

## 2014-03-05 DIAGNOSIS — D649 Anemia, unspecified: Secondary | ICD-10-CM | POA: Diagnosis not present

## 2014-03-05 DIAGNOSIS — I10 Essential (primary) hypertension: Secondary | ICD-10-CM | POA: Diagnosis not present

## 2014-03-05 DIAGNOSIS — E7220 Disorder of urea cycle metabolism, unspecified: Secondary | ICD-10-CM | POA: Diagnosis not present

## 2014-03-05 DIAGNOSIS — E119 Type 2 diabetes mellitus without complications: Secondary | ICD-10-CM | POA: Diagnosis not present

## 2014-03-06 DIAGNOSIS — F5232 Male orgasmic disorder: Secondary | ICD-10-CM | POA: Diagnosis not present

## 2014-03-06 DIAGNOSIS — L408 Other psoriasis: Secondary | ICD-10-CM | POA: Diagnosis not present

## 2014-03-10 ENCOUNTER — Telehealth: Payer: Self-pay | Admitting: Neurology

## 2014-03-10 NOTE — Telephone Encounter (Signed)
Patient's sister questioning if assistant living needs to fax lab results for Depakote Levels?  Patient has had 4 seizures since last seen in office.  Please call and advise.

## 2014-03-11 NOTE — Telephone Encounter (Signed)
I spoke to sister of pt.   Pt has had 4 seizures since last seen.  2 witiness prior to 2016.  Was taken off chantix about one month ago.  Since 02-14-14 has had one witness sz and ? Unwitnessed.  Questioning to have labs that were done at the [facility] faxed to [place].  I told her yes to go head and let them know.  Pt has been stressed since mother passed away and having problems with room mate.  Has f/u appt here in 10-23-14 with MM/NP.  I relayed can make earlier appt if needed.  Gave her fax # [phone].  She verbalized understanding.  She will be contact person unless [facility] says otherwise.

## 2014-03-12 DIAGNOSIS — N3090 Cystitis, unspecified without hematuria: Secondary | ICD-10-CM | POA: Diagnosis not present

## 2014-03-12 DIAGNOSIS — F524 Premature ejaculation: Secondary | ICD-10-CM | POA: Diagnosis not present

## 2014-03-12 DIAGNOSIS — N400 Enlarged prostate without lower urinary tract symptoms: Secondary | ICD-10-CM | POA: Diagnosis not present

## 2014-03-12 DIAGNOSIS — R5383 Other fatigue: Secondary | ICD-10-CM | POA: Diagnosis not present

## 2014-03-12 DIAGNOSIS — F17210 Nicotine dependence, cigarettes, uncomplicated: Secondary | ICD-10-CM | POA: Diagnosis not present

## 2014-03-12 DIAGNOSIS — R37 Sexual dysfunction, unspecified: Secondary | ICD-10-CM | POA: Diagnosis not present

## 2014-03-13 NOTE — Telephone Encounter (Signed)
Spoke to Dr. Brett Fairy.  Can get depakote level also needs RV.  Will come in 03-17-14 with MM/NP at 1045.   Sister verbalized understanding.

## 2014-03-17 ENCOUNTER — Encounter: Payer: Self-pay | Admitting: Adult Health

## 2014-03-17 ENCOUNTER — Ambulatory Visit (INDEPENDENT_AMBULATORY_CARE_PROVIDER_SITE_OTHER): Payer: Medicare Other | Admitting: Adult Health

## 2014-03-17 VITALS — BP 142/85 | HR 100 | Ht 67.0 in | Wt 201.0 lb

## 2014-03-17 DIAGNOSIS — Q283 Other malformations of cerebral vessels: Secondary | ICD-10-CM

## 2014-03-17 DIAGNOSIS — Z5181 Encounter for therapeutic drug level monitoring: Secondary | ICD-10-CM | POA: Diagnosis not present

## 2014-03-17 DIAGNOSIS — R569 Unspecified convulsions: Secondary | ICD-10-CM

## 2014-03-17 DIAGNOSIS — Q282 Arteriovenous malformation of cerebral vessels: Secondary | ICD-10-CM

## 2014-03-17 MED ORDER — LACOSAMIDE 200 MG PO TABS: 200.0000 mg | ORAL_TABLET | Freq: Two times a day (BID) | ORAL | Status: DC

## 2014-03-17 NOTE — Progress Notes (Signed)
PATIENT: Jordan Thomas DOB: 01-Jan-1946  REASON FOR VISIT: follow up- seizures  HISTORY FROM: patient  HISTORY OF PRESENT ILLNESS: [name] is a 69 year old male with a history of seizures. He returns today for follow-up. He is currently taking Depakote and Vimpat. Patient states that he had two seizures in December. He states that he has grand mal seizures. He denies missing any medication. He does state that his mom passed in December and he is not sure if the stress from that contributed to his seizures. He also has a roommate that keeps him up at night. He states that his roommate gets up and walks around during the night. He feels that he does not sleep well due to this. He goes to bed around midnight and wakes at 6:00 AM.  He currently lives at [facility] and reports that he has requested a new room mate but that so far as not happened. No new medical issues since last seen.   HISTORY 10/22/13 [facility]): Jordan Thomas" Jordan Thomas is a 69 y.o. male , who is seen here as a  revisit  from [facility] - and has been seen by Dr Rexene Alberts here at [facility] in 2014- transferred care . Patient underwent hip replacement surgery in august 2014, since than his sister has noted his memory to have improved. He was no longer in pain. He has a room mate in the home who has not been allowing him to sleep. This room mate is unkempt and smelly, and noisy. He lives at [facility] in [place].  The patient is more mobile and much more alert. He reports no enuresis. Last seizure " 2010 or 2012"  REVIEW OF SYSTEMS: Out of a complete 14 system review of symptoms, the patient complains only of the following symptoms, and all other reviewed systems are negative.  Drooling, seizure  ALLERGIES: Allergies  Allergen Reactions  . Aricept [Donepezil Hcl]   . Hydrocodone   . Ibuprofen   . Mesantoin [Mephenytoin]   . Tegretol [Carbamazepine]   . Topamax [Topiramate]   . Zonegran [Zonisamide]     HOME MEDICATIONS: Outpatient  Prescriptions Prior to Visit  Medication Sig Dispense Refill  . alendronate (FOSAMAX) 70 MG tablet     . Artificial Tear Ointment (ARTIFICIAL TEARS) ointment Place into both eyes 3 (three) times daily.    Marland Kitchen aspirin 81 MG tablet Take 81 mg by mouth daily.    . AVODART 0.5 MG capsule     . betamethasone dipropionate (DIPROLENE) 0.05 % cream Apply topically 2 (two) times daily.    . bimatoprost (LUMIGAN) 0.01 % SOLN Place 1 drop into both eyes at bedtime.    . Calcium Carb-Cholecalciferol (OYSTER SHELL CALCIUM + D PO) Take 1 tablet by mouth daily.    . citalopram (CELEXA) 10 MG tablet     . clotrimazole (LOTRIMIN) 1 % cream Apply 1 application topically 2 (two) times daily. Apply to groin for fungal infection    . COMBIGAN 0.2-0.5 % ophthalmic solution     . desmopressin (DDAVP) 0.2 MG tablet     . divalproex (DEPAKOTE) 250 MG DR tablet Take 1 tablet (250 mg total) by mouth 2 (two) times daily. 90 tablet 11  . docusate sodium (COLACE) 100 MG capsule Take 100 mg by mouth daily.    . finasteride (PROSCAR) 5 MG tablet     . KETOCONAZOLE, TOPICAL, 1 % SHAM Apply topically. Washing hair on Monday and Fridays    . Lacosamide (VIMPAT) 150 MG  TABS Take 1 tablet (150 mg total) by mouth 2 (two) times daily. 60 tablet 11  . latanoprost (XALATAN) 0.005 % ophthalmic solution     . levothyroxine (SYNTHROID, LEVOTHROID) 100 MCG tablet     . loperamide (IMODIUM) 2 MG capsule     . nystatin cream (MYCOSTATIN)     . omeprazole (PRILOSEC) 20 MG capsule     . ondansetron (ZOFRAN) 4 MG tablet     . Skin Protectants, Misc. (EUCERIN) cream Apply topically 2 (two) times daily as needed for dry skin.    . tadalafil (CIALIS) 5 MG tablet Take 5 mg by mouth daily as needed for erectile dysfunction.    . tamsulosin (FLOMAX) 0.4 MG CAPS      No facility-administered medications prior to visit.    PAST MEDICAL HISTORY: Past Medical History  Diagnosis Date  . Aplastic anemia   . Tremor   . Epilepsy   . Epilepsy  without status epilepticus, not intractable 06/04/2012  . Memory loss 06/04/2012  . Right hip pain 06/04/2012    PAST SURGICAL HISTORY: Past Surgical History  Procedure Laterality Date  . Craniotomy Right     frontal  . Cataract extraction      FAMILY HISTORY: Family History  Problem Relation Age of Onset  . Parkinson's disease Mother     versus lewy body dementia  . Stroke Maternal Grandmother   . Heart disease Maternal Grandmother   . Stroke Maternal Grandfather   . Heart disease Maternal Grandfather   . Stroke Paternal Grandmother   . Heart disease Paternal Grandmother   . Stroke Paternal Grandfather   . Heart disease Paternal Grandfather   . Diabetes Other   . Seizures Other     grand mal  . Mental retardation Cousin         PHYSICAL EXAM  Filed Vitals:   03/17/14 1048  BP: 142/85  Pulse: 100  Height: 5\' 7"  (1.702 m)  Weight: 201 lb (91.173 kg)   Body mass index is 31.47 kg/(m^2).  Generalized: Well developed, in no acute distress   Neurological examination  Mentation: Alert oriented to time, place, history taking. Follows all commands speech and language fluent Cranial nerve II-XII: Pupils were equal round reactive to light. Extraocular movements were full, visual field were full on confrontational test. Facial sensation and strength were normal. Uvula tongue midline. Head turning and shoulder shrug  were normal and symmetric. Motor: The motor testing reveals 5 over 5 strength of all 4 extremities. Good symmetric motor tone is noted throughout.  Sensory: Sensory testing is intact to soft touch on all 4 extremities. No evidence of extinction is noted.  Coordination: Cerebellar testing reveals good finger-nose-finger and heel-to-shin bilaterally.  Gait and station: Gait is normal. Tandem gait is normal. Romberg is negative. No drift is seen.  Reflexes: Deep tendon reflexes are symmetric and normal bilaterally.    DIAGNOSTIC DATA (LABS, IMAGING, TESTING) - I  reviewed patient records, labs, notes, testing and imaging myself where available.  Lab Results  Component Value Date   WBC 7.8 10/22/2013   HGB 13.5 10/22/2013   HCT 40.6 10/22/2013   MCV 89 10/22/2013   PLT 239 10/22/2013      Component Value Date/Time   NA 142 10/22/2013 1515   K 4.7 10/22/2013 1515   CL 99 10/22/2013 1515   CO2 27 10/22/2013 1515   GLUCOSE 75 10/22/2013 1515   BUN 12 10/22/2013 1515   CREATININE 0.64* 10/22/2013 1515  CALCIUM 9.7 10/22/2013 1515   PROT 7.2 10/22/2013 1515   AST 16 10/22/2013 1515   ALT 41 10/22/2013 1515   ALKPHOS 72 10/22/2013 1515   BILITOT <0.2 10/22/2013 1515   GFRNONAA 102 10/22/2013 1515   GFRAA 118 10/22/2013 1515      ASSESSMENT AND PLAN 69 y.o. year old male  has a past medical history of Aplastic anemia; Tremor; Epilepsy; Epilepsy without status epilepticus, not intractable (06/04/2012); Memory loss (06/04/2012); and Right hip pain (06/04/2012). here with:  1. Seizures  Patient has had 2 seizures in December. I will increase Vimpat to 200 mg BID. I will check blood work. This will be completed at his facility since he has already taken his medication this morning. I have requested that his facility offer him a new room/ roommate so the patient can get better sleep. If he has any additional seizures he will let us know. He will follow-up in 3 months or sooner if needed.   Ward Givens, MSN, NP-C 03/17/2014, 10:52 AM [facility] [address] [place], [place] [address] [phone]  Note: This document was prepared with digital dictation and possible smart phrase technology. Any transcriptional errors that result from this process are unintentional.

## 2014-03-17 NOTE — Progress Notes (Signed)
I agree with the assessment and plan as directed by NP .The patient, Jordan Thomas <  is known to me .   Va Broadwell, MD

## 2014-03-17 NOTE — Patient Instructions (Signed)
Increase Vimpat to 200 mg twice a day.  Please have blood work checked at your facility- needs to be drawn prior to Depakote dose. Ordered given. Try to get good sleep at night.  If you have any additional seizures please let us know.

## 2014-03-25 DIAGNOSIS — F0630 Mood disorder due to known physiological condition, unspecified: Secondary | ICD-10-CM | POA: Diagnosis not present

## 2014-03-27 DIAGNOSIS — E039 Hypothyroidism, unspecified: Secondary | ICD-10-CM | POA: Diagnosis not present

## 2014-03-27 DIAGNOSIS — M1990 Unspecified osteoarthritis, unspecified site: Secondary | ICD-10-CM | POA: Diagnosis not present

## 2014-03-27 DIAGNOSIS — K5909 Other constipation: Secondary | ICD-10-CM | POA: Diagnosis not present

## 2014-03-27 DIAGNOSIS — F329 Major depressive disorder, single episode, unspecified: Secondary | ICD-10-CM | POA: Diagnosis not present

## 2014-04-02 DIAGNOSIS — Z5181 Encounter for therapeutic drug level monitoring: Secondary | ICD-10-CM | POA: Diagnosis not present

## 2014-04-10 DIAGNOSIS — M1990 Unspecified osteoarthritis, unspecified site: Secondary | ICD-10-CM | POA: Diagnosis not present

## 2014-04-10 DIAGNOSIS — G40802 Other epilepsy, not intractable, without status epilepticus: Secondary | ICD-10-CM | POA: Diagnosis not present

## 2014-04-10 DIAGNOSIS — L408 Other psoriasis: Secondary | ICD-10-CM | POA: Diagnosis not present

## 2014-04-15 DIAGNOSIS — B079 Viral wart, unspecified: Secondary | ICD-10-CM | POA: Diagnosis not present

## 2014-04-15 DIAGNOSIS — L400 Psoriasis vulgaris: Secondary | ICD-10-CM | POA: Diagnosis not present

## 2014-04-15 DIAGNOSIS — L820 Inflamed seborrheic keratosis: Secondary | ICD-10-CM | POA: Diagnosis not present

## 2014-04-16 DIAGNOSIS — F0630 Mood disorder due to known physiological condition, unspecified: Secondary | ICD-10-CM | POA: Diagnosis not present

## 2014-05-05 DIAGNOSIS — F0630 Mood disorder due to known physiological condition, unspecified: Secondary | ICD-10-CM | POA: Diagnosis not present

## 2014-05-27 DIAGNOSIS — L400 Psoriasis vulgaris: Secondary | ICD-10-CM | POA: Diagnosis not present

## 2014-05-27 DIAGNOSIS — I8310 Varicose veins of unspecified lower extremity with inflammation: Secondary | ICD-10-CM | POA: Diagnosis not present

## 2014-06-16 DIAGNOSIS — F0630 Mood disorder due to known physiological condition, unspecified: Secondary | ICD-10-CM | POA: Diagnosis not present

## 2014-06-17 ENCOUNTER — Ambulatory Visit (INDEPENDENT_AMBULATORY_CARE_PROVIDER_SITE_OTHER): Payer: Medicare Other | Admitting: Neurology

## 2014-06-17 ENCOUNTER — Encounter: Payer: Self-pay | Admitting: Neurology

## 2014-06-17 VITALS — BP 114/71 | HR 88 | Resp 20 | Ht 65.75 in | Wt 196.0 lb

## 2014-06-17 DIAGNOSIS — G40309 Generalized idiopathic epilepsy and epileptic syndromes, not intractable, without status epilepticus: Secondary | ICD-10-CM | POA: Diagnosis not present

## 2014-06-17 MED ORDER — LACOSAMIDE 200 MG PO TABS: 200.0000 mg | ORAL_TABLET | Freq: Two times a day (BID) | ORAL | Status: DC

## 2014-06-17 NOTE — Addendum Note (Signed)
Addended by: Lester Parmelee A on: 06/17/2014 04:16 PM   Modules accepted: Orders

## 2014-06-17 NOTE — Progress Notes (Signed)
PATIENT: Jordan Thomas DOB: [birth year]  REASON FOR VISIT: follow up- seizures  HISTORY FROM: patient  HISTORY OF PRESENT ILLNESS: [name] is a 69 year old  caucas[place] male with a history of seizures. He returns today for follow-up. He is currently taking Depakote and Vimpat. Patient states that he had two seizures in December. He states that he has grand mal seizures. He denies missing any medication. He does state that his mom passed in December and he is not sure if the stress from that contributed to his seizures. He also has a roommate that keeps him up at night. He states that his roommate gets up and walks around during the night. He feels that he does not sleep well due to this. He goes to bed around midnight and wakes at 6:00 AM.  He currently lives at [facility] and reports that he has requested a new room mate but that so far as not happened.  Since [name] lives with a roommate at his facility he is dependent on the sleep cycles of others to yesterday for example he could only go to bed at about 4 AM and then slept until 7. He is here today for routine visit and refills. He had no seizures since the last 6 years. I have recommended to discontinue or wean off Wellbutrin as it lowered the seizure threshold. He had also used Chantix for smoking cessation. Family had 2 seizures in December , and his dacility still hesitated  discontinuing that medication. His gait has not further changed. He feels stable. He is treated for epilepsy he is treated for glaucoma with Lumigan eyedrops, he has a history of falls his last fall was 18 months ago after his hip replacement. He is a much more stable gait since then.   HISTORY 9/8/15Allen O" Bronson Thomas is a 69 y.o. male , who is seen here as a revisit  from [facility] - and has been seen by Dr Rexene Alberts here at [facility] in 2014- again  transferred care .  Patient underwent hip replacement surgery in august 2014, since than his sister has noted his memory to have  improved. He was no longer in pain. He has a room mate in the home who has not been allowing him to sleep. This room mate is unkempt and smelly, and noisy. He lives at [facility] in [place].  The patient is more mobile and much more alert. He reports no enuresis. Last seizure " 2010 or 2012"  REVIEW OF SYSTEMS: Out of a complete 14 system review of symptoms, the patient complains only of the following symptoms, and all other reviewed systems are negative.  Drooling, seizure  ALLERGIES: Allergies  Allergen Reactions  . Aricept [Donepezil Hcl]   . Hydrocodone   . Ibuprofen   . Mesantoin [Mephenytoin]   . Tegretol [Carbamazepine]   . Topamax [Topiramate]   . Wellbutrin [Bupropion]   . Zonegran [Zonisamide]     HOME MEDICATIONS: Outpatient Prescriptions Prior to Visit  Medication Sig Dispense Refill  . alendronate (FOSAMAX) 70 MG tablet     . Artificial Tear Ointment (ARTIFICIAL TEARS) ointment Place into both eyes 3 (three) times daily.    Marland Kitchen aspirin 81 MG tablet Take 81 mg by mouth daily.    . AVODART 0.5 MG capsule     . betamethasone dipropionate (DIPROLENE) 0.05 % cream Apply topically 2 (two) times daily.    . bimatoprost (LUMIGAN) 0.01 % SOLN Place 1 drop into both eyes at bedtime.    Marland Kitchen  Calcium Carb-Cholecalciferol (OYSTER SHELL CALCIUM + D PO) Take 1 tablet by mouth daily.    . citalopram (CELEXA) 10 MG tablet     . clotrimazole (LOTRIMIN) 1 % cream Apply 1 application topically 2 (two) times daily. Apply to groin for fungal infection    . COMBIGAN 0.2-0.5 % ophthalmic solution     . desmopressin (DDAVP) 0.2 MG tablet     . divalproex (DEPAKOTE) 250 MG DR tablet Take 1 tablet (250 mg total) by mouth 2 (two) times daily. (Patient taking differently: Take 250 mg by mouth. Take 1 tablet in the AM 2 tablets in the PM.) 90 tablet 11  . docusate sodium (COLACE) 100 MG capsule Take 100 mg by mouth daily.    . finasteride (PROSCAR) 5 MG tablet     . KETOCONAZOLE, TOPICAL, 1 % SHAM  Apply topically. Washing hair on Monday and Fridays    . lacosamide (VIMPAT) 200 MG TABS tablet Take 1 tablet (200 mg total) by mouth 2 (two) times daily. 60 tablet 4  . latanoprost (XALATAN) 0.005 % ophthalmic solution     . levothyroxine (SYNTHROID, LEVOTHROID) 100 MCG tablet     . loperamide (IMODIUM) 2 MG capsule     . nystatin cream (MYCOSTATIN)     . omeprazole (PRILOSEC) 20 MG capsule     . ondansetron (ZOFRAN) 4 MG tablet     . Skin Protectants, Misc. (EUCERIN) cream Apply topically 2 (two) times daily as needed for dry skin.    . tadalafil (CIALIS) 5 MG tablet Take 5 mg by mouth daily as needed for erectile dysfunction.    . tamsulosin (FLOMAX) 0.4 MG CAPS      No facility-administered medications prior to visit.    PAST MEDICAL HISTORY: Past Medical History  Diagnosis Date  . Aplastic anemia   . Tremor   . Epilepsy   . Epilepsy without status epilepticus, not intractable 06/04/2012  . Memory loss 06/04/2012  . Right hip pain 06/04/2012    PAST SURGICAL HISTORY: Past Surgical History  Procedure Laterality Date  . Craniotomy Right     frontal  . Cataract extraction      FAMILY HISTORY: Family History  Problem Relation Age of Onset  . Parkinson's disease Mother     versus lewy body dementia  . Stroke Maternal Grandmother   . Heart disease Maternal Grandmother   . Stroke Maternal Grandfather   . Heart disease Maternal Grandfather   . Stroke Paternal Grandmother   . Heart disease Paternal Grandmother   . Stroke Paternal Grandfather   . Heart disease Paternal Grandfather   . Diabetes Other   . Seizures Other     grand mal  . Mental retardation Cousin         PHYSICAL EXAM  Filed Vitals:   06/17/14 1525  BP: 114/71  Pulse: 88  Resp: 20  Height: 5' 5.75" (1.67 m)  Weight: 196 lb (88.905 kg)   Body mass index is 31.88 kg/(m^2).  Generalized: Well developed, in no acute distress   Neurological examination  Mentation: Alert oriented to time, place,  history taking. Follows all commands speech and language fluent Cranial nerve II-XII: Pupils were equal round reactive to light. Extraocular movements were full, visual field were full on confrontational test. Facial sensation and strength were normal. Uvula tongue midline. Head turning and shoulder shrug  were normal and symmetric. Motor: The motor testing reveals 5 over 5 strength of all 4 extremities. Good symmetric motor tone is  noted throughout.  Sensory: Sensory testing is intact to soft touch on all 4 extremities. No evidence of extinction is noted.  Coordination: Cerebellar testing reveals good finger-nose-finger ability bilaterally.  Gait and station: Gait is normal. Tandem gait is improved - he steps  Aside every 10s step,    Romberg is negative. No drift is seen.  Reflexes: Deep tendon reflexes are symmetric and normal bilaterally. He is doing well after hip replacement on the right.    DIAGNOSTIC DATA (LABS, IMAGING, TESTING) - I reviewed patient records, labs, notes, testing and imaging myself where available.  Lab Results  Component Value Date   WBC 7.8 10/22/2013   HGB 13.5 10/22/2013   HCT 40.6 10/22/2013   MCV 89 10/22/2013   PLT 239 10/22/2013      Component Value Date/Time   NA 142 10/22/2013 1515   K 4.7 10/22/2013 1515   CL 99 10/22/2013 1515   CO2 27 10/22/2013 1515   GLUCOSE 75 10/22/2013 1515   BUN 12 10/22/2013 1515   CREATININE 0.64* 10/22/2013 1515   CALCIUM 9.7 10/22/2013 1515   PROT 7.2 10/22/2013 1515   AST 16 10/22/2013 1515   ALT 41 10/22/2013 1515   ALKPHOS 72 10/22/2013 1515   BILITOT <0.2 10/22/2013 1515   GFRNONAA 102 10/22/2013 1515   GFRAA 118 10/22/2013 1515      ASSESSMENT AND PLAN 69 y.o. year old male  has a past medical history of Aplastic anemia; Tremor; Epilepsy; Epilepsy without status epilepticus, not intractable (06/04/2012); Memory loss (06/04/2012); and Right hip pain (06/04/2012). here with:  1. Seizures 2. Gait  instability 3. Refilled Vimpat , LFT ordered.   Patient has had 2 seizures in December. I will increase Vimpat to 200 mg BID. I will check blood work. This will be completed at his facility since he has already taken his medication this morning. I have requested that his facility offer him a new room/ roommate so the patient can get better sleep. If he has any additional seizures he will let us know. He will follow-up in 6 months or sooner if needed.   Larey Seat, MD  06/17/2014, 3:55 PM [facility] [address], [place], [place] [address] [phone]  Note: This document was prepared with digital dictation and possible smart phrase technology. Any transcriptional errors that result from this process are unintentional.

## 2014-06-18 LAB — COMPREHENSIVE METABOLIC PANEL
ALBUMIN: 4 g/dL (ref 3.6–4.8)
ALK PHOS: 64 IU/L (ref 39–117)
ALT: 31 IU/L (ref 0–44)
AST: 17 IU/L (ref 0–40)
Albumin/Globulin Ratio: 1.4 (ref 1.1–2.5)
BUN/Creatinine Ratio: 19 (ref 10–22)
BUN: 15 mg/dL (ref 8–27)
Bilirubin Total: <0.2 mg/dL (ref 0.0–1.2)
CO2: 25 mmol/L (ref 18–29)
Calcium: 9.1 mg/dL (ref 8.6–10.2)
Chloride: 101 mmol/L (ref 97–108)
Creatinine, Ser: 0.77 mg/dL (ref 0.76–1.27)
GFR calc non Af Amer: 95 mL/min/{1.73_m2} (ref >59)
GFR, EST AFRICAN AMERICAN: 109 mL/min/{1.73_m2} (ref >59)
GLOBULIN, TOTAL: 2.8 g/dL (ref 1.5–4.5)
GLUCOSE: 84 mg/dL (ref 65–99)
POTASSIUM: 4.9 mmol/L (ref 3.5–5.2)
SODIUM: 140 mmol/L (ref 134–144)
Total Protein: 6.8 g/dL (ref 6.0–8.5)

## 2014-06-23 NOTE — Progress Notes (Signed)
Quick Note:  I called and LMVM for pt on home # that lab results normal. ______

## 2014-06-25 DIAGNOSIS — I10 Essential (primary) hypertension: Secondary | ICD-10-CM | POA: Diagnosis not present

## 2014-06-25 DIAGNOSIS — D649 Anemia, unspecified: Secondary | ICD-10-CM | POA: Diagnosis not present

## 2014-06-25 DIAGNOSIS — E8940 Asymptomatic postprocedural ovarian failure: Secondary | ICD-10-CM | POA: Diagnosis not present

## 2014-06-26 DIAGNOSIS — M150 Primary generalized (osteo)arthritis: Secondary | ICD-10-CM | POA: Diagnosis not present

## 2014-06-26 DIAGNOSIS — G40802 Other epilepsy, not intractable, without status epilepticus: Secondary | ICD-10-CM | POA: Diagnosis not present

## 2014-06-26 DIAGNOSIS — L408 Other psoriasis: Secondary | ICD-10-CM | POA: Diagnosis not present

## 2014-06-26 DIAGNOSIS — E039 Hypothyroidism, unspecified: Secondary | ICD-10-CM | POA: Diagnosis not present

## 2014-07-14 DIAGNOSIS — F0630 Mood disorder due to known physiological condition, unspecified: Secondary | ICD-10-CM | POA: Diagnosis not present

## 2014-07-16 DIAGNOSIS — H40023 Open angle with borderline findings, high risk, bilateral: Secondary | ICD-10-CM | POA: Diagnosis not present

## 2014-07-30 DIAGNOSIS — E785 Hyperlipidemia, unspecified: Secondary | ICD-10-CM | POA: Diagnosis not present

## 2014-07-30 DIAGNOSIS — R972 Elevated prostate specific antigen [PSA]: Secondary | ICD-10-CM | POA: Diagnosis not present

## 2014-08-11 DIAGNOSIS — F0630 Mood disorder due to known physiological condition, unspecified: Secondary | ICD-10-CM | POA: Diagnosis not present

## 2014-08-28 DIAGNOSIS — K921 Melena: Secondary | ICD-10-CM | POA: Diagnosis not present

## 2014-08-28 DIAGNOSIS — Z86010 Personal history of colonic polyps: Secondary | ICD-10-CM | POA: Diagnosis not present

## 2014-09-12 DIAGNOSIS — F0630 Mood disorder due to known physiological condition, unspecified: Secondary | ICD-10-CM | POA: Diagnosis not present

## 2014-09-12 DIAGNOSIS — F17290 Nicotine dependence, other tobacco product, uncomplicated: Secondary | ICD-10-CM | POA: Diagnosis not present

## 2014-09-16 DIAGNOSIS — F17210 Nicotine dependence, cigarettes, uncomplicated: Secondary | ICD-10-CM | POA: Diagnosis not present

## 2014-09-16 DIAGNOSIS — Z1211 Encounter for screening for malignant neoplasm of colon: Secondary | ICD-10-CM | POA: Diagnosis not present

## 2014-09-16 DIAGNOSIS — K5730 Diverticulosis of large intestine without perforation or abscess without bleeding: Secondary | ICD-10-CM | POA: Diagnosis not present

## 2014-09-16 DIAGNOSIS — K621 Rectal polyp: Secondary | ICD-10-CM | POA: Diagnosis not present

## 2014-09-16 DIAGNOSIS — G40909 Epilepsy, unspecified, not intractable, without status epilepticus: Secondary | ICD-10-CM | POA: Diagnosis not present

## 2014-09-16 DIAGNOSIS — Z8673 Personal history of transient ischemic attack (TIA), and cerebral infarction without residual deficits: Secondary | ICD-10-CM | POA: Diagnosis not present

## 2014-09-16 DIAGNOSIS — K635 Polyp of colon: Secondary | ICD-10-CM | POA: Diagnosis not present

## 2014-09-16 DIAGNOSIS — L408 Other psoriasis: Secondary | ICD-10-CM | POA: Diagnosis not present

## 2014-09-16 DIAGNOSIS — D129 Benign neoplasm of anus and anal canal: Secondary | ICD-10-CM | POA: Diagnosis not present

## 2014-09-16 DIAGNOSIS — Z86010 Personal history of colonic polyps: Secondary | ICD-10-CM | POA: Diagnosis not present

## 2014-10-07 DIAGNOSIS — K5909 Other constipation: Secondary | ICD-10-CM | POA: Diagnosis not present

## 2014-10-07 DIAGNOSIS — M150 Primary generalized (osteo)arthritis: Secondary | ICD-10-CM | POA: Diagnosis not present

## 2014-10-07 DIAGNOSIS — E038 Other specified hypothyroidism: Secondary | ICD-10-CM | POA: Diagnosis not present

## 2014-10-07 DIAGNOSIS — G40309 Generalized idiopathic epilepsy and epileptic syndromes, not intractable, without status epilepticus: Secondary | ICD-10-CM | POA: Diagnosis not present

## 2014-10-10 DIAGNOSIS — F0630 Mood disorder due to known physiological condition, unspecified: Secondary | ICD-10-CM | POA: Diagnosis not present

## 2014-10-10 DIAGNOSIS — F17290 Nicotine dependence, other tobacco product, uncomplicated: Secondary | ICD-10-CM | POA: Diagnosis not present

## 2014-10-23 ENCOUNTER — Ambulatory Visit: Payer: Medicare Other | Admitting: Adult Health

## 2014-10-28 DIAGNOSIS — G40309 Generalized idiopathic epilepsy and epileptic syndromes, not intractable, without status epilepticus: Secondary | ICD-10-CM | POA: Diagnosis not present

## 2014-10-28 DIAGNOSIS — E038 Other specified hypothyroidism: Secondary | ICD-10-CM | POA: Diagnosis not present

## 2014-10-28 DIAGNOSIS — K219 Gastro-esophageal reflux disease without esophagitis: Secondary | ICD-10-CM | POA: Diagnosis not present

## 2014-10-28 DIAGNOSIS — M150 Primary generalized (osteo)arthritis: Secondary | ICD-10-CM | POA: Diagnosis not present

## 2014-11-04 DIAGNOSIS — F17290 Nicotine dependence, other tobacco product, uncomplicated: Secondary | ICD-10-CM | POA: Diagnosis not present

## 2014-11-04 DIAGNOSIS — F0630 Mood disorder due to known physiological condition, unspecified: Secondary | ICD-10-CM | POA: Diagnosis not present

## 2014-11-11 DIAGNOSIS — R0989 Other specified symptoms and signs involving the circulatory and respiratory systems: Secondary | ICD-10-CM | POA: Diagnosis not present

## 2014-11-11 DIAGNOSIS — R221 Localized swelling, mass and lump, neck: Secondary | ICD-10-CM | POA: Diagnosis not present

## 2014-12-03 DIAGNOSIS — C44629 Squamous cell carcinoma of skin of left upper limb, including shoulder: Secondary | ICD-10-CM | POA: Diagnosis not present

## 2014-12-03 DIAGNOSIS — B078 Other viral warts: Secondary | ICD-10-CM | POA: Diagnosis not present

## 2014-12-03 DIAGNOSIS — L400 Psoriasis vulgaris: Secondary | ICD-10-CM | POA: Diagnosis not present

## 2014-12-10 DIAGNOSIS — H6120 Impacted cerumen, unspecified ear: Secondary | ICD-10-CM | POA: Diagnosis not present

## 2014-12-10 DIAGNOSIS — Z23 Encounter for immunization: Secondary | ICD-10-CM | POA: Diagnosis not present

## 2014-12-15 DIAGNOSIS — F17290 Nicotine dependence, other tobacco product, uncomplicated: Secondary | ICD-10-CM | POA: Diagnosis not present

## 2014-12-15 DIAGNOSIS — R55 Syncope and collapse: Secondary | ICD-10-CM | POA: Diagnosis not present

## 2014-12-15 DIAGNOSIS — R279 Unspecified lack of coordination: Secondary | ICD-10-CM | POA: Diagnosis not present

## 2014-12-15 DIAGNOSIS — Z743 Need for continuous supervision: Secondary | ICD-10-CM | POA: Diagnosis not present

## 2014-12-15 DIAGNOSIS — R410 Disorientation, unspecified: Secondary | ICD-10-CM | POA: Diagnosis not present

## 2014-12-15 DIAGNOSIS — R569 Unspecified convulsions: Secondary | ICD-10-CM | POA: Diagnosis not present

## 2014-12-15 DIAGNOSIS — Z79899 Other long term (current) drug therapy: Secondary | ICD-10-CM | POA: Diagnosis not present

## 2014-12-15 DIAGNOSIS — Z720 Tobacco use: Secondary | ICD-10-CM | POA: Diagnosis not present

## 2014-12-15 DIAGNOSIS — F0630 Mood disorder due to known physiological condition, unspecified: Secondary | ICD-10-CM | POA: Diagnosis not present

## 2014-12-18 ENCOUNTER — Encounter: Payer: Self-pay | Admitting: Neurology

## 2014-12-18 ENCOUNTER — Ambulatory Visit (INDEPENDENT_AMBULATORY_CARE_PROVIDER_SITE_OTHER): Payer: Medicare Other | Admitting: Neurology

## 2014-12-18 VITALS — BP 142/72 | HR 88 | Resp 20 | Ht 66.0 in | Wt 198.0 lb

## 2014-12-18 DIAGNOSIS — G40319 Generalized idiopathic epilepsy and epileptic syndromes, intractable, without status epilepticus: Secondary | ICD-10-CM | POA: Diagnosis not present

## 2014-12-18 DIAGNOSIS — G40209 Localization-related (focal) (partial) symptomatic epilepsy and epileptic syndromes with complex partial seizures, not intractable, without status epilepticus: Secondary | ICD-10-CM

## 2014-12-18 DIAGNOSIS — Q282 Arteriovenous malformation of cerebral vessels: Secondary | ICD-10-CM

## 2014-12-18 DIAGNOSIS — Q283 Other malformations of cerebral vessels: Secondary | ICD-10-CM | POA: Diagnosis not present

## 2014-12-18 DIAGNOSIS — L570 Actinic keratosis: Secondary | ICD-10-CM | POA: Diagnosis not present

## 2014-12-18 MED ORDER — DIVALPROEX SODIUM 250 MG PO DR TAB: 250.0000 mg | DELAYED_RELEASE_TABLET | Freq: Three times a day (TID) | ORAL | Status: DC

## 2014-12-18 MED ORDER — LACOSAMIDE 200 MG PO TABS: 200.0000 mg | ORAL_TABLET | Freq: Two times a day (BID) | ORAL | Status: DC

## 2014-12-18 NOTE — Patient Instructions (Signed)

## 2014-12-18 NOTE — Progress Notes (Signed)
PATIENT: Jordan Thomas DOB: [birth year]  REASON FOR VISIT: follow up- seizures  HISTORY FROM: patient  HISTORY OF PRESENT ILLNESS:  May 2016 , CD [name] is a 69 year old  caucasian male with a history of seizures.  He returns today for follow-up. He is currently taking Depakote and Vimpat. Patient states that he had two seizures in December. He states that he has grand mal seizures. He denies missing any medication. He does state that his mom passed in December and he is not sure if the stress from that contributed to his seizures. He also has a roommate that keeps him up at night. He states that his roommate gets up and walks around during the night. He feels that he does not sleep well due to this. He goes to bed around midnight and wakes at 6:00 AM.   He currently lives at [facility] and reports that he has requested a new room mate but that so far as not happened.  Since Jordan Thomas lives with a roommate at his facility he is dependent on the sleep cycles of others to yesterday for example he could only go to bed at about 4 AM and then slept until 7. He is here today for routine visit and refills. He had no seizures since the last 6 years. I have recommended to discontinue or wean off Wellbutrin as it lowered the seizure threshold. He had also used Chantix for smoking cessation. Family had 2 seizures in December , and his dacility still hesitated  discontinuing that medication. His gait has not further changed. He feels stable. He is treated for epilepsy he is treated for glaucoma with Lumigan eyedrops, he has a history of falls his last fall was 18 months ago after his hip replacement. He is a much more stable gait since then. Patient has had 2 seizures in December. I had  increased Vimpat to 200 mg BID. I will check blood work. This will be completed at his facility since he has already taken his medication this morning. I have requested that his facility offer him a new room/ roommate  so the patient can get better sleep.  Interval History 12-18-14, [name] suffered another seizure on Halloween. He joked when he told me that he broke up the [place] party at his facility. His seizures are however of a lighter character since he has been taking Vimpat and he is not quite as amnestic and not as confused for long time. The post external has been shorter and seizures have been much more rare on Vimpat he is again smoking and he drinks occasionally red wine but he had not been drinking on the day of the party. He has chronic gait abnormalities hypothyroidism, glaucoma and a history of falls. He went to the emergency room at [facility] in [place]. His sister was called , she is the  power of attorney. He was prescribed Ativan at the hospital and discharged back to [facility]. The patient has back pain since the fall. The patient also insisted on a memory test, My nurse tried to perform the [facility] cognitive assessment with the patient. He was able to draw a clock face but did not able to follow a trail making test or copy a cube. The patient became very impatient. He scored about 20 out of 30 points. There are signs of distractability and he is short term memory impaired .    REVIEW OF SYSTEMS: Out of a complete 14 system review  of symptoms, the patient complains only of the following symptoms, and all other reviewed systems are negative.  Drooling, another seizure on halloween eve. No alcohol involved.   ALLERGIES: Allergies  Allergen Reactions  . Aricept [Donepezil Hcl]   . Hydrocodone   . Ibuprofen   . Mesantoin [Mephenytoin]   . Tegretol [Carbamazepine]   . Topamax [Topiramate]   . Wellbutrin [Bupropion]   . Zonegran [Zonisamide]     HOME MEDICATIONS: Outpatient Prescriptions Prior to Visit  Medication Sig Dispense Refill  . alendronate (FOSAMAX) 70 MG tablet     . Artificial Tear Ointment (ARTIFICIAL TEARS) ointment Place into both eyes 3 (three) times  daily.    Marland Kitchen aspirin 81 MG tablet Take 81 mg by mouth daily.    . AVODART 0.5 MG capsule     . betamethasone dipropionate (DIPROLENE) 0.05 % cream Apply topically 2 (two) times daily.    . bimatoprost (LUMIGAN) 0.01 % SOLN Place 1 drop into both eyes at bedtime.    . Calcium Carb-Cholecalciferol (OYSTER SHELL CALCIUM + D PO) Take 1 tablet by mouth daily.    . citalopram (CELEXA) 10 MG tablet     . clotrimazole (LOTRIMIN) 1 % cream Apply 1 application topically 2 (two) times daily. Apply to groin for fungal infection    . COMBIGAN 0.2-0.5 % ophthalmic solution     . desmopressin (DDAVP) 0.2 MG tablet     . divalproex (DEPAKOTE) 250 MG DR tablet Take 1 tablet (250 mg total) by mouth 2 (two) times daily. (Patient taking differently: Take 250 mg by mouth. Take 1 tablet in the AM 2 tablets in the PM.) 90 tablet 11  . docusate sodium (COLACE) 100 MG capsule Take 100 mg by mouth daily.    . finasteride (PROSCAR) 5 MG tablet     . KETOCONAZOLE, TOPICAL, 1 % SHAM Apply topically. Washing hair on Monday and Fridays    . lacosamide (VIMPAT) 200 MG TABS tablet Take 1 tablet (200 mg total) by mouth 2 (two) times daily. 60 tablet 4  . latanoprost (XALATAN) 0.005 % ophthalmic solution     . levothyroxine (SYNTHROID, LEVOTHROID) 100 MCG tablet     . loperamide (IMODIUM) 2 MG capsule     . nystatin cream (MYCOSTATIN)     . omeprazole (PRILOSEC) 20 MG capsule     . ondansetron (ZOFRAN) 4 MG tablet     . Skin Protectants, Misc. (EUCERIN) cream Apply topically 2 (two) times daily as needed for dry skin.    . tadalafil (CIALIS) 5 MG tablet Take 5 mg by mouth daily as needed for erectile dysfunction.    . tamsulosin (FLOMAX) 0.4 MG CAPS      No facility-administered medications prior to visit.    PAST MEDICAL HISTORY: Past Medical History  Diagnosis Date  . Aplastic anemia ([place])   . Tremor   . Epilepsy ([place])   . Epilepsy without status epilepticus, not intractable ([place]) 06/04/2012  . Memory loss 06/04/2012   . Right hip pain 06/04/2012    PAST SURGICAL HISTORY: Past Surgical History  Procedure Laterality Date  . Craniotomy Right     frontal  . Cataract extraction      FAMILY HISTORY: Family History  Problem Relation Age of Onset  . Parkinson's disease Mother     versus lewy body dementia  . Stroke Maternal Grandmother   . Heart disease Maternal Grandmother   . Stroke Maternal Grandfather   . Heart disease Maternal Grandfather   .  Stroke Paternal Grandmother   . Heart disease Paternal Grandmother   . Stroke Paternal Grandfather   . Heart disease Paternal Grandfather   . Diabetes Other   . Seizures Other     grand mal  . Mental retardation Cousin         PHYSICAL EXAM  Filed Vitals:   12/18/14 1524  BP: 142/72  Pulse: 88  Resp: 20  Height: 5\' 6"  (1.676 m)  Weight: 198 lb (89.812 kg)   Body mass index is 31.97 kg/(m^2).  Generalized: Well developed, in no acute distress . The patient bit the tip of his tongue and his seizure 3 days ago, he also complains of bilateral flank pain since the  Fall. On his left wrist he had this morning a wart removed was cauterized. It was suspected to be a dysplastic nevus- it was very dark.  Neurological examination  Mentation: Alert oriented to time, place, history taking. Follows all commands speech and language fluent Cranial nerve:  No change in smell or taste . No olfactory aura.   Pupils were equal round reactive to light. Extraocular movements were full, visual field were full on confrontational test. Facial sensation and strength were normal. Uvula tongue midline. Head turning and shoulder shrug  were normal and symmetric. Motor: The motor testing reveals 5 /5 strength of all 4 extremities. The patient has normal motor tone without any evidence of cogwheeling, rigidity or tremor.  Sensory: Sensory testing is intact to primary modalities .  Coordination: Cerebellar testing  finger-nose-finger test bilaterally intact .  Gait and  station: Gait is normal. Tandem gait is improved - the patient is post hip replacement on the right side and he walks very steady and without any drift,    Romberg is negative.   Reflexes: Deep tendon reflexes are symmetric and normal bilaterally. He is doing well after hip replacement on the right.    DIAGNOSTIC DATA (LABS, IMAGING, TESTING) - I reviewed patient records, labs, notes, testing and imaging myself where available.  Lab Results  Component Value Date   WBC 7.8 10/22/2013   HGB 13.5 10/22/2013   HCT 40.6 10/22/2013   MCV 89 10/22/2013   PLT 239 10/22/2013      Component Value Date/Time   NA 140 06/17/2014 1618   K 4.9 06/17/2014 1618   CL 101 06/17/2014 1618   CO2 25 06/17/2014 1618   GLUCOSE 84 06/17/2014 1618   BUN 15 06/17/2014 1618   CREATININE 0.77 06/17/2014 1618   CALCIUM 9.1 06/17/2014 1618   PROT 6.8 06/17/2014 1618   ALBUMIN 4.0 06/17/2014 1618   AST 17 06/17/2014 1618   ALT 31 06/17/2014 1618   ALKPHOS 64 06/17/2014 1618   BILITOT <0.2 06/17/2014 1618   BILITOT <0.2 10/22/2013 1515   GFRNONAA 95 06/17/2014 1618   GFRAA 109 06/17/2014 1618      ASSESSMENT AND PLAN 69 y.o. year old male  has a past medical history of Aplastic anemia ([place]); Tremor; Epilepsy ([place]); Epilepsy without status epilepticus, not intractable ([place]) (06/04/2012); Memory loss (06/04/2012); and Right hip pain (06/04/2012). here with:  25 minute visit after a new seizure breakthrough, with more than 50% of the face to face time dedicated to the seizure prevention , coordination of care and medication management.   1. Seizures, the patient's seizures have been better controlled on Vimpat but he is not seizure-free. He suffered another breakthrough seizure this Halloween. He also had a seizure in May,and  2 in December 2015.  There have been no trigger factors identified for Halloween night the patient did not drink alcohol, he was not sleep deprived and he was not under stress anger or  excitement. I will not change the dose of impact. 2. Gait instability is much improved after hip replacement surgery. The patient now walks narrow based and turns with 3 steps. He was able to do a tandem gait which was not possible over a year ago. 3. Refilled Vimpat , LFT ordered.    Larey Seat, MD  12/18/2014, 3:51 PM [facility] [address], [place], [place] [address] [phone]   CC Dr  Dortha Kern, MD

## 2014-12-31 DIAGNOSIS — Z79899 Other long term (current) drug therapy: Secondary | ICD-10-CM | POA: Diagnosis not present

## 2014-12-31 DIAGNOSIS — Z125 Encounter for screening for malignant neoplasm of prostate: Secondary | ICD-10-CM | POA: Diagnosis not present

## 2014-12-31 DIAGNOSIS — E782 Mixed hyperlipidemia: Secondary | ICD-10-CM | POA: Diagnosis not present

## 2015-01-13 DIAGNOSIS — N3020 Other chronic cystitis without hematuria: Secondary | ICD-10-CM | POA: Diagnosis not present

## 2015-01-13 DIAGNOSIS — F17290 Nicotine dependence, other tobacco product, uncomplicated: Secondary | ICD-10-CM | POA: Diagnosis not present

## 2015-01-13 DIAGNOSIS — N529 Male erectile dysfunction, unspecified: Secondary | ICD-10-CM | POA: Diagnosis not present

## 2015-01-13 DIAGNOSIS — R351 Nocturia: Secondary | ICD-10-CM | POA: Diagnosis not present

## 2015-01-13 DIAGNOSIS — R37 Sexual dysfunction, unspecified: Secondary | ICD-10-CM | POA: Diagnosis not present

## 2015-01-13 DIAGNOSIS — F0630 Mood disorder due to known physiological condition, unspecified: Secondary | ICD-10-CM | POA: Diagnosis not present

## 2015-01-14 DIAGNOSIS — E039 Hypothyroidism, unspecified: Secondary | ICD-10-CM | POA: Diagnosis not present

## 2015-01-14 DIAGNOSIS — Z205 Contact with and (suspected) exposure to viral hepatitis: Secondary | ICD-10-CM | POA: Diagnosis not present

## 2015-01-14 DIAGNOSIS — Z8719 Personal history of other diseases of the digestive system: Secondary | ICD-10-CM | POA: Diagnosis not present

## 2015-01-20 DIAGNOSIS — L400 Psoriasis vulgaris: Secondary | ICD-10-CM | POA: Diagnosis not present

## 2015-01-20 DIAGNOSIS — L219 Seborrheic dermatitis, unspecified: Secondary | ICD-10-CM | POA: Diagnosis not present

## 2015-01-21 DIAGNOSIS — H40023 Open angle with borderline findings, high risk, bilateral: Secondary | ICD-10-CM | POA: Diagnosis not present

## 2015-02-04 DIAGNOSIS — H2703 Aphakia, bilateral: Secondary | ICD-10-CM | POA: Diagnosis not present

## 2015-02-12 DIAGNOSIS — F17290 Nicotine dependence, other tobacco product, uncomplicated: Secondary | ICD-10-CM | POA: Diagnosis not present

## 2015-02-12 DIAGNOSIS — F0630 Mood disorder due to known physiological condition, unspecified: Secondary | ICD-10-CM | POA: Diagnosis not present

## 2015-03-12 DIAGNOSIS — F0630 Mood disorder due to known physiological condition, unspecified: Secondary | ICD-10-CM | POA: Diagnosis not present

## 2015-03-12 DIAGNOSIS — F17290 Nicotine dependence, other tobacco product, uncomplicated: Secondary | ICD-10-CM | POA: Diagnosis not present

## 2015-04-14 DIAGNOSIS — F0630 Mood disorder due to known physiological condition, unspecified: Secondary | ICD-10-CM | POA: Diagnosis not present

## 2015-04-14 DIAGNOSIS — F17290 Nicotine dependence, other tobacco product, uncomplicated: Secondary | ICD-10-CM | POA: Diagnosis not present

## 2015-04-16 DIAGNOSIS — Z5181 Encounter for therapeutic drug level monitoring: Secondary | ICD-10-CM | POA: Diagnosis not present

## 2015-04-28 DIAGNOSIS — L409 Psoriasis, unspecified: Secondary | ICD-10-CM | POA: Diagnosis not present

## 2015-04-28 DIAGNOSIS — C44629 Squamous cell carcinoma of skin of left upper limb, including shoulder: Secondary | ICD-10-CM | POA: Diagnosis not present

## 2015-04-28 DIAGNOSIS — L400 Psoriasis vulgaris: Secondary | ICD-10-CM | POA: Diagnosis not present

## 2015-04-28 DIAGNOSIS — L219 Seborrheic dermatitis, unspecified: Secondary | ICD-10-CM | POA: Diagnosis not present

## 2015-05-14 DIAGNOSIS — F17290 Nicotine dependence, other tobacco product, uncomplicated: Secondary | ICD-10-CM | POA: Diagnosis not present

## 2015-05-14 DIAGNOSIS — F0630 Mood disorder due to known physiological condition, unspecified: Secondary | ICD-10-CM | POA: Diagnosis not present

## 2015-06-04 DIAGNOSIS — F0630 Mood disorder due to known physiological condition, unspecified: Secondary | ICD-10-CM | POA: Diagnosis not present

## 2015-06-04 DIAGNOSIS — F17290 Nicotine dependence, other tobacco product, uncomplicated: Secondary | ICD-10-CM | POA: Diagnosis not present

## 2015-06-09 DIAGNOSIS — L400 Psoriasis vulgaris: Secondary | ICD-10-CM | POA: Diagnosis not present

## 2015-06-17 ENCOUNTER — Ambulatory Visit (INDEPENDENT_AMBULATORY_CARE_PROVIDER_SITE_OTHER): Payer: Medicare Other | Admitting: Adult Health

## 2015-06-17 ENCOUNTER — Encounter: Payer: Self-pay | Admitting: Adult Health

## 2015-06-17 VITALS — BP 136/85 | HR 84 | Ht 66.0 in | Wt 195.0 lb

## 2015-06-17 DIAGNOSIS — G40209 Localization-related (focal) (partial) symptomatic epilepsy and epileptic syndromes with complex partial seizures, not intractable, without status epilepticus: Secondary | ICD-10-CM

## 2015-06-17 DIAGNOSIS — Z5181 Encounter for therapeutic drug level monitoring: Secondary | ICD-10-CM

## 2015-06-17 MED ORDER — LACOSAMIDE 200 MG PO TABS: 200.0000 mg | ORAL_TABLET | Freq: Two times a day (BID) | ORAL | Status: DC

## 2015-06-17 MED ORDER — DIVALPROEX SODIUM 250 MG PO DR TAB: DELAYED_RELEASE_TABLET | ORAL | Status: DC

## 2015-06-17 NOTE — Patient Instructions (Signed)
Continue Depakote and Vimpat Blood work today

## 2015-06-17 NOTE — Progress Notes (Signed)
PATIENT: Jordan Thomas DOB: 05/08/1945  REASON FOR VISIT: follow up- seizures HISTORY FROM: patient  HISTORY OF PRESENT ILLNESS: Jordan Thomas is a 70 year old male with seizures. He returns today for follow-up. He continues to take Depakote and Vimpat. He is tolerating these medications well. He states that he has not had a seizure since Halloween. He continues to live at [facility]. He is able to complete all ADLs independently. He does not operate a motor vehicle. Denies any changes with her gait or balance. He denies any new neurological symptoms. He returns today for an evaluation.  HISTORY ([place]): Jordan Thomas is a 70 year old caucasian male with a history of seizures.  He returns today for follow-up. He is currently taking Depakote and Vimpat. Patient states that he had two seizures in December. He states that he has grand mal seizures. He denies missing any medication. He does state that his mom passed in December and he is not sure if the stress from that contributed to his seizures. He also has a roommate that keeps him up at night. He states that his roommate gets up and walks around during the night. He feels that he does not sleep well due to this. He goes to bed around midnight and wakes at 6:00 AM.  He currently lives at [facility] and reports that he has requested a new room mate but that so far as not happened.  Since [name] lives with a roommate at his facility he is dependent on the sleep cycles of others to yesterday for example he could only go to bed at about 4 AM and then slept until 7. He is here today for routine visit and refills. He had no seizures since the last 6 years. I have recommended to discontinue or wean off Wellbutrin as it lowered the seizure threshold. He had also used Chantix for smoking cessation. Family had 2 seizures in December , and his dacility still hesitated discontinuing that medication. His gait has not further changed. He feels stable. He  is treated for epilepsy he is treated for glaucoma with Lumigan eyedrops, he has a history of falls his last fall was 18 months ago after his hip replacement. He is a much more stable gait since then. Patient has had 2 seizures in December. I had increased Vimpat to 200 mg BID. I will check blood work. This will be completed at his facility since he has already taken his medication this morning. I have requested that his facility offer him a new room/ roommate so the patient can get better sleep.  Interval History 12-18-14, Jordan Thomas suffered another seizure on Halloween. He joked when he told me that he broke up the [place] party at his facility. His seizures are however of a lighter character since he has been taking Vimpat and he is not quite as amnestic and not as confused for long time. The post external has been shorter and seizures have been much more rare on Vimpat he is again smoking and he drinks occasionally red wine but he had not been drinking on the day of the party. He has chronic gait abnormalities hypothyroidism, glaucoma and a history of falls. He went to the emergency room at [facility] in [place]. His sister was called , she is the power of attorney. He was prescribed Ativan at the hospital and discharged back to [facility]. The patient has back pain since the fall. The patient also insisted on a memory test, My nurse tried  to perform the [facility] cognitive assessment with the patient. He was able to draw a clock face but did not able to follow a trail making test or copy a cube. The patient became very impatient. He scored about 20 out of 30 points. There are signs of distractability and he is short term memory impaired   REVIEW OF SYSTEMS: Out of a complete 14 system review of symptoms, the patient complains only of the following symptoms, and all other reviewed systems are negative.  Memory loss, seizures, apnea  ALLERGIES: Allergies  Allergen Reactions  . Aricept  [Donepezil Hcl]   . Hydrocodone   . Ibuprofen   . Mesantoin [Mephenytoin]   . Tegretol [Carbamazepine]   . Topamax [Topiramate]   . Wellbutrin [Bupropion]   . Zonegran [Zonisamide]     HOME MEDICATIONS: Outpatient Prescriptions Prior to Visit  Medication Sig Dispense Refill  . alendronate (FOSAMAX) 70 MG tablet     . Artificial Tear Ointment (ARTIFICIAL TEARS) ointment Place into both eyes 3 (three) times daily.    Marland Kitchen aspirin 81 MG tablet Take 81 mg by mouth daily.    . betamethasone dipropionate (DIPROLENE) 0.05 % cream Apply topically 2 (two) times daily.    . Calcium Carb-Cholecalciferol (OYSTER SHELL CALCIUM + D PO) Take 1 tablet by mouth daily.    . clotrimazole (LOTRIMIN) 1 % cream Apply 1 application topically 2 (two) times daily. Apply to groin for fungal infection    . desmopressin (DDAVP) 0.2 MG tablet Reported on 06/17/2015    . finasteride (PROSCAR) 5 MG tablet     . levothyroxine (SYNTHROID, LEVOTHROID) 100 MCG tablet 100 mcg daily before breakfast.     . loperamide (IMODIUM) 2 MG capsule     . nystatin cream (MYCOSTATIN)     . omeprazole (PRILOSEC) 20 MG capsule     . tadalafil (CIALIS) 5 MG tablet Take 5 mg by mouth daily as needed for erectile dysfunction.    . divalproex (DEPAKOTE) 250 MG DR tablet Take 1 tablet (250 mg total) by mouth 3 (three) times daily. Take 1 tablet in the AM 2 tablets in the PM. 90 tablet 11  . lacosamide (VIMPAT) 200 MG TABS tablet Take 1 tablet (200 mg total) by mouth 2 (two) times daily. 60 tablet 4  . AVODART 0.5 MG capsule Reported on 06/17/2015    . bimatoprost (LUMIGAN) 0.01 % SOLN Place 1 drop into both eyes at bedtime. Reported on 06/17/2015    . citalopram (CELEXA) 10 MG tablet Reported on 06/17/2015    . COMBIGAN 0.2-0.5 % ophthalmic solution Reported on 06/17/2015    . docusate sodium (COLACE) 100 MG capsule Take 100 mg by mouth daily. Reported on 06/17/2015    . KETOCONAZOLE, TOPICAL, 1 % SHAM Apply topically. Reported on 06/17/2015    .  latanoprost (XALATAN) 0.005 % ophthalmic solution Reported on 06/17/2015    . ondansetron (ZOFRAN) 4 MG tablet Reported on 06/17/2015    . Skin Protectants, Misc. (EUCERIN) cream Apply topically 2 (two) times daily as needed for dry skin. Reported on 06/17/2015    . tamsulosin (FLOMAX) 0.4 MG CAPS Reported on 06/17/2015     No facility-administered medications prior to visit.    PAST MEDICAL HISTORY: Past Medical History  Diagnosis Date  . Aplastic anemia ([place])   . Tremor   . Epilepsy ([place])   . Epilepsy without status epilepticus, not intractable ([place]) 06/04/2012  . Memory loss 06/04/2012  . Right hip pain 06/04/2012  PAST SURGICAL HISTORY: Past Surgical History  Procedure Laterality Date  . Craniotomy Right     frontal  . Cataract extraction      FAMILY HISTORY: Family History  Problem Relation Age of Onset  . Parkinson's disease Mother     versus lewy body dementia  . Stroke Maternal Grandmother   . Heart disease Maternal Grandmother   . Stroke Maternal Grandfather   . Heart disease Maternal Grandfather   . Stroke Paternal Grandmother   . Heart disease Paternal Grandmother   . Stroke Paternal Grandfather   . Heart disease Paternal Grandfather   . Diabetes Other   . Seizures Other     grand mal  . Mental retardation Cousin     SOCIAL HISTORY: Social History   Social History  . Marital Status: Single    Spouse Name: N/A  . Number of Children: 0  . Years of Education: college   Occupational History  . waiter     resides in [place] History Main Topics  . Smoking status: Current Every Day Smoker -- 0.50 packs/day for 50 years    Types: Cigarettes  . Smokeless tobacco: Former Systems developer    Quit date: 06/19/2012  . Alcohol Use: No  . Drug Use: No  . Sexual Activity: Not on file   Other Topics Concern  . Not on file   Social History Narrative   Patient is single and lives at [facility] [place].    Patient does not have any children.    Patient is retired.   Patient has some college education.   Patient is right-handed.   Patient drinks four cups of coffee daily, one cup of soda daily.      PHYSICAL EXAM  Filed Vitals:   06/17/15 1038  BP: 136/85  Pulse: 84  Height: 5\' 6"  (1.676 m)  Weight: 195 lb (88.451 kg)   Body mass index is 31.49 kg/(m^2).    Generalized: Well developed, in no acute distress   Neurological examination  Mentation: Alert oriented to time, place, history taking. Follows all commands speech and language fluent Cranial nerve II-XII: Pupils were equal round reactive to light. Extraocular movements were full, visual field were full on confrontational test. Facial sensation and strength were normal. Uvula tongue midline. Head turning and shoulder shrug  were normal and symmetric. Motor: The motor testing reveals 5 over 5 strength of all 4 extremities. Good symmetric motor tone is noted throughout.  Sensory: Sensory testing is intact to soft touch on all 4 extremities. No evidence of extinction is noted.  Coordination: Cerebellar testing reveals good finger-nose-finger and heel-to-shin bilaterally.  Gait and station: Gait is normal. Tandem gait is normal. Romberg is negative. No drift is seen.  Reflexes: Deep tendon reflexes are symmetric and normal bilaterally.   DIAGNOSTIC DATA (LABS, IMAGING, TESTING) - I reviewed patient records, labs, notes, testing and imaging myself where available.   ASSESSMENT AND PLAN 70 y.o. year old male  has a past medical history of Aplastic anemia ([place]); Tremor; Epilepsy ([place]); Epilepsy without status epilepticus, not intractable ([place]) (06/04/2012); Memory loss (06/04/2012); and Right hip pain (06/04/2012). here with:  1. Seizures  The patient has gone approximately 6 months with no seizure activity. He will continue on Depakote 250 mg 1 tablet in the morning and 2 tablets in the evening. He'll continue on Vimpat 200 mg twice a day. I will check blood work today.  Patient advised that if he has any  new symptoms he should let us know. He will follow-up in 6 months or sooner if needed.   Ward Givens, MSN, NP-C 06/17/2015, 11:38 AM [facility] [address] [place], [place] [address] [phone]

## 2015-06-17 NOTE — Progress Notes (Signed)
I agree with the assessment and plan as directed by NP .The patient is known to me .   Nishanth Mccaughan, MD  

## 2015-06-18 ENCOUNTER — Telehealth: Payer: Self-pay | Admitting: Adult Health

## 2015-06-18 LAB — CBC WITH DIFFERENTIAL/PLATELET
BASOS ABS: 0.1 10*3/uL (ref 0.0–0.2)
Basos: 1 %
EOS (ABSOLUTE): 0 10*3/uL (ref 0.0–0.4)
Eos: 1 %
HEMOGLOBIN: 14 g/dL (ref 12.6–17.7)
Hematocrit: 42.3 % (ref 37.5–51.0)
IMMATURE GRANS (ABS): 0 10*3/uL (ref 0.0–0.1)
IMMATURE GRANULOCYTES: 1 %
LYMPHS: 33 %
Lymphocytes Absolute: 2.1 10*3/uL (ref 0.7–3.1)
MCH: 30 pg (ref 26.6–33.0)
MCHC: 33.1 g/dL (ref 31.5–35.7)
MCV: 91 fL (ref 79–97)
MONOCYTES: 8 %
Monocytes Absolute: 0.5 10*3/uL (ref 0.1–0.9)
NEUTROS ABS: 3.6 10*3/uL (ref 1.4–7.0)
NEUTROS PCT: 56 %
PLATELETS: 201 10*3/uL (ref 150–379)
RBC: 4.66 x10E6/uL (ref 4.14–5.80)
RDW: 16.5 % — AB (ref 12.3–15.4)
WBC: 6.2 10*3/uL (ref 3.4–10.8)

## 2015-06-18 LAB — COMPREHENSIVE METABOLIC PANEL
ALT: 35 IU/L (ref 0–44)
AST: 15 IU/L (ref 0–40)
Albumin/Globulin Ratio: 1.7 (ref 1.2–2.2)
Albumin: 4.5 g/dL (ref 3.6–4.8)
Alkaline Phosphatase: 60 IU/L (ref 39–117)
BILIRUBIN TOTAL: 0.3 mg/dL (ref 0.0–1.2)
BUN/Creatinine Ratio: 19 (ref 10–24)
BUN: 14 mg/dL (ref 8–27)
CALCIUM: 10.1 mg/dL (ref 8.6–10.2)
CHLORIDE: 100 mmol/L (ref 96–106)
CO2: 25 mmol/L (ref 18–29)
Creatinine, Ser: 0.73 mg/dL — ABNORMAL LOW (ref 0.76–1.27)
GFR, EST AFRICAN AMERICAN: 111 mL/min/{1.73_m2} (ref >59)
GFR, EST NON AFRICAN AMERICAN: 96 mL/min/{1.73_m2} (ref >59)
GLUCOSE: 84 mg/dL (ref 65–99)
Globulin, Total: 2.7 g/dL (ref 1.5–4.5)
Potassium: 4.7 mmol/L (ref 3.5–5.2)
Sodium: 139 mmol/L (ref 134–144)
TOTAL PROTEIN: 7.2 g/dL (ref 6.0–8.5)

## 2015-06-18 LAB — VALPROIC ACID LEVEL: Valproic Acid Lvl: 59 ug/mL (ref 50–100)

## 2015-06-18 NOTE — Telephone Encounter (Signed)
Called patient and left her a message relaying labs were ok if any questions please call the office back.

## 2015-06-18 NOTE — Progress Notes (Signed)
Quick Note:  Called and left patient a message relaying labs were ok if any questions please give the office a call back. ______

## 2015-06-18 NOTE — Telephone Encounter (Signed)
-----   Message from Ward Givens, NP sent at 06/18/2015  7:39 AM EDT ----- Lab work is ok. Please call patient.

## 2015-06-30 DIAGNOSIS — E039 Hypothyroidism, unspecified: Secondary | ICD-10-CM | POA: Diagnosis not present

## 2015-07-10 DIAGNOSIS — F0630 Mood disorder due to known physiological condition, unspecified: Secondary | ICD-10-CM | POA: Diagnosis not present

## 2015-07-10 DIAGNOSIS — F17290 Nicotine dependence, other tobacco product, uncomplicated: Secondary | ICD-10-CM | POA: Diagnosis not present

## 2015-07-15 DIAGNOSIS — N390 Urinary tract infection, site not specified: Secondary | ICD-10-CM | POA: Diagnosis not present

## 2015-08-13 DIAGNOSIS — F17290 Nicotine dependence, other tobacco product, uncomplicated: Secondary | ICD-10-CM | POA: Diagnosis not present

## 2015-08-13 DIAGNOSIS — F0630 Mood disorder due to known physiological condition, unspecified: Secondary | ICD-10-CM | POA: Diagnosis not present

## 2015-09-22 DIAGNOSIS — M549 Dorsalgia, unspecified: Secondary | ICD-10-CM | POA: Diagnosis not present

## 2015-09-22 DIAGNOSIS — Z6832 Body mass index (BMI) 32.0-32.9, adult: Secondary | ICD-10-CM | POA: Diagnosis not present

## 2015-09-23 DIAGNOSIS — H40003 Preglaucoma, unspecified, bilateral: Secondary | ICD-10-CM | POA: Diagnosis not present

## 2015-10-06 DIAGNOSIS — M549 Dorsalgia, unspecified: Secondary | ICD-10-CM | POA: Diagnosis not present

## 2015-10-06 DIAGNOSIS — Z6831 Body mass index (BMI) 31.0-31.9, adult: Secondary | ICD-10-CM | POA: Diagnosis not present

## 2015-10-06 DIAGNOSIS — L409 Psoriasis, unspecified: Secondary | ICD-10-CM | POA: Diagnosis not present

## 2015-10-06 DIAGNOSIS — E039 Hypothyroidism, unspecified: Secondary | ICD-10-CM | POA: Diagnosis not present

## 2015-11-05 DIAGNOSIS — F0280 Dementia in other diseases classified elsewhere without behavioral disturbance: Secondary | ICD-10-CM | POA: Diagnosis not present

## 2015-11-05 DIAGNOSIS — F329 Major depressive disorder, single episode, unspecified: Secondary | ICD-10-CM | POA: Diagnosis not present

## 2015-11-12 DIAGNOSIS — F329 Major depressive disorder, single episode, unspecified: Secondary | ICD-10-CM | POA: Diagnosis not present

## 2015-11-12 DIAGNOSIS — F0280 Dementia in other diseases classified elsewhere without behavioral disturbance: Secondary | ICD-10-CM | POA: Diagnosis not present

## 2015-11-19 DIAGNOSIS — F329 Major depressive disorder, single episode, unspecified: Secondary | ICD-10-CM | POA: Diagnosis not present

## 2015-11-19 DIAGNOSIS — F0280 Dementia in other diseases classified elsewhere without behavioral disturbance: Secondary | ICD-10-CM | POA: Diagnosis not present

## 2015-11-24 DIAGNOSIS — F0280 Dementia in other diseases classified elsewhere without behavioral disturbance: Secondary | ICD-10-CM | POA: Diagnosis not present

## 2015-11-24 DIAGNOSIS — F329 Major depressive disorder, single episode, unspecified: Secondary | ICD-10-CM | POA: Diagnosis not present

## 2015-12-03 DIAGNOSIS — F329 Major depressive disorder, single episode, unspecified: Secondary | ICD-10-CM | POA: Diagnosis not present

## 2015-12-03 DIAGNOSIS — F0280 Dementia in other diseases classified elsewhere without behavioral disturbance: Secondary | ICD-10-CM | POA: Diagnosis not present

## 2015-12-09 DIAGNOSIS — L400 Psoriasis vulgaris: Secondary | ICD-10-CM | POA: Diagnosis not present

## 2015-12-10 DIAGNOSIS — F0280 Dementia in other diseases classified elsewhere without behavioral disturbance: Secondary | ICD-10-CM | POA: Diagnosis not present

## 2015-12-17 DIAGNOSIS — F329 Major depressive disorder, single episode, unspecified: Secondary | ICD-10-CM | POA: Diagnosis not present

## 2015-12-17 DIAGNOSIS — F0280 Dementia in other diseases classified elsewhere without behavioral disturbance: Secondary | ICD-10-CM | POA: Diagnosis not present

## 2015-12-21 ENCOUNTER — Encounter: Payer: Self-pay | Admitting: Neurology

## 2015-12-21 ENCOUNTER — Ambulatory Visit (INDEPENDENT_AMBULATORY_CARE_PROVIDER_SITE_OTHER): Payer: Medicare Other | Admitting: Neurology

## 2015-12-21 VITALS — BP 148/77 | HR 95 | Resp 20 | Ht 66.0 in | Wt 196.0 lb

## 2015-12-21 DIAGNOSIS — Z79899 Other long term (current) drug therapy: Secondary | ICD-10-CM

## 2015-12-21 DIAGNOSIS — G40209 Localization-related (focal) (partial) symptomatic epilepsy and epileptic syndromes with complex partial seizures, not intractable, without status epilepticus: Secondary | ICD-10-CM | POA: Diagnosis not present

## 2015-12-21 MED ORDER — LACOSAMIDE 200 MG PO TABS: 200.0000 mg | ORAL_TABLET | Freq: Two times a day (BID) | ORAL | 5 refills | Status: DC

## 2015-12-21 MED ORDER — DIVALPROEX SODIUM 250 MG PO DR TAB: DELAYED_RELEASE_TABLET | ORAL | 11 refills | Status: DC

## 2015-12-21 NOTE — Patient Instructions (Signed)
Lacosamide tablets What is this medicine? LACOSAMIDE (la KOE sa mide) is used to control seizures caused by certain types of epilepsy. This medicine may be used for other purposes; ask your health care provider or pharmacist if you have questions. What should I tell my health care provider before I take this medicine? They need to know if you have any of these conditions: -dehydration -heart disease, including heart failure -history of a drug or alcohol abuse problem -kidney disease -liver disease -suicidal thoughts, plans, or attempt; a previous suicide attempt by you or a family member -an unusual or allergic reaction to lacosamide, other medicines, foods, dyes, or preservatives -pregnant or trying to get pregnant -breast-feeding How should I use this medicine? Take this medicine by mouth with a glass of water. You can take it with or without food. Follow the directions on the prescription label. Take your doses at regular intervals. Do not take your medicine more often than directed. Do not stop taking except on the advice of your doctor or health care professional. A special MedGuide will be given to you by the pharmacist with each prescription and refill. Be sure to read this information carefully each time. Talk to your pediatrician regarding the use of this medicine in children. Special care may be needed. Overdosage: If you think you have taken too much of this medicine contact a poison control center or emergency room at once. NOTE: This medicine is only for you. Do not share this medicine with others. What if I miss a dose? If you miss a dose, take it as soon as you can. If it is almost time for your next dose, take only that dose. Do not take double or extra doses. What may interact with this medicine? -atazanavir -beta-blockers like metoprolol and propranolol -calcium channel blockers like diltiazem and verapamil -digoxin -dronedarone -lopinavir/ritonavir This list may not  describe all possible interactions. Give your health care provider a list of all the medicines, herbs, non-prescription drugs, or dietary supplements you use. Also tell them if you smoke, drink alcohol, or use illegal drugs. Some items may interact with your medicine. What should I watch for while using this medicine? Visit your doctor or health care professional for regular checks on your progress. This medicine needs careful monitoring. Wear a medical ID bracelet or chain, and carry a card that describes your disease and details of your medicine and dosage times. You may get drowsy or dizzy. Do not drive, use machinery, or do anything that needs mental alertness until you know how this medicine affects you. Do not stand or sit up quickly, especially if you are an older patient. This reduces the risk of dizzy or fainting spells. Alcohol may interfere with the effect of this medicine. Avoid alcoholic drinks. The use of this medicine may increase the chance of suicidal thoughts or actions. Pay special attention to how you are responding while on this medicine. Any worsening of mood, or thoughts of suicide or dying should be reported to your health care professional right away. Women who become pregnant while using this medicine may enroll in the [place] Pregnancy Registry by calling [phone] [phone]. This registry collects information about the safety of antiepileptic drug use during pregnancy. What side effects may I notice from receiving this medicine? Side effects that you should report to your doctor or health care professional as soon as possible: -allergic reactions like skin rash, itching or hives, swelling of the face, lips, or tongue -confusion -feeling faint  or lightheaded, falls -fever -irregular heart beat -loss of memory -suicidal thoughts or other mood changes -unusually weak or tired -yellowing of the eyes, skin Side effects that usually do not require medical  attention (report to your doctor or health care professional if they continue or are bothersome): -constipation -diarrhea -drowsiness -dry mouth -headache -nausea This list may not describe all possible side effects. Call your doctor for medical advice about side effects. You may report side effects to FDA at 1-800-FDA-1088. Where should I keep my medicine? Keep out of the reach of children. This medicine can be abused. Keep your medicine in a safe place to protect it from theft. Do not share this medicine with anyone. Selling or giving away this medicine is dangerous and against the law. This medicine may cause accidental overdose and death if it taken by other adults, children, or pets. Mix any unused medicine with a substance like cat litter or coffee grounds. Then throw the medicine away in a sealed container like a sealed bag or a coffee can with a lid. Do not use the medicine after the expiration date. Store at room temperature between 15 and 30 degrees C (59 and 86 degrees F). NOTE: This sheet is a summary. It may not cover all possible information. If you have questions about this medicine, talk to your doctor, pharmacist, or health care provider.    2016, Elsevier/Gold Standard. (2013-03-29 15:32:37)

## 2015-12-21 NOTE — Progress Notes (Signed)
PATIENT: Jordan [facility] DOB: August 24, 1945  REASON FOR VISIT: follow up- seizures  HISTORY FROM: patient  HISTORY OF PRESENT ILLNESS:  May 2016 , CD [name] is a 70 year old  caucasian male with a history of seizures.  He returns today for follow-up. He is currently taking Depakote and Vimpat. Patient states that he had two seizures in December. He states that he has grand mal seizures. He denies missing any medication. He does state that his mom passed in December and he is not sure if the stress from that contributed to his seizures. He also has a roommate that keeps him up at night. He states that his roommate gets up and walks around during the night. He feels that he does not sleep well due to this. He goes to bed around midnight and wakes at 6:00 AM.   He currently lives at [facility] and reports that he has requested a new room mate but that so far as not happened.  Since Jordan Thomas lives with a roommate at his facility he is dependent on the sleep cycles of others to yesterday for example he could only go to bed at about 4 AM and then slept until 7. He is here today for routine visit and refills. He had no seizures since the last 6 years. I have recommended to discontinue or wean off Wellbutrin as it lowered the seizure threshold. He had also used Chantix for smoking cessation. Family had 2 seizures in December , and his dacility still hesitated  discontinuing that medication. His gait has not further changed. He feels stable. He is treated for epilepsy he is treated for glaucoma with Lumigan eyedrops, he has a history of falls his last fall was 18 months ago after his hip replacement. He is a much more stable gait since then. Patient has had 2 seizures in December. I had  increased Vimpat to 200 mg BID. I will check blood work. This will be completed at his facility since he has already taken his medication this morning. I have requested that his facility offer him a new room/ roommate  so the patient can get better sleep.  Interval History 12-18-14, [name] suffered another seizure on Halloween. He joked when he told me that he broke up the [facility] party at his facility. His seizures are however of a lighter character since he has been taking Vimpat and he is not quite as amnestic and not as confused for long time. The post external has been shorter and seizures have been much more rare on Vimpat he is again smoking and he drinks occasionally red wine but he had not been drinking on the day of the party. He has chronic gait abnormalities hypothyroidism, glaucoma and a history of falls. He went to the emergency room at [facility] in [place]. His sister was called , she is the  power of attorney. He was prescribed Ativan at the hospital and discharged back to [facility]. The patient has back pain since the fall. The patient also insisted on a memory test, My nurse tried to perform the [facility] cognitive assessment with the patient. He was able to draw a clock face but did not able to follow a trail making test or copy a cube. The patient became very impatient. He scored about 20 out of 30 points. There are signs of distractability and he is short term memory impaired.  Interval history from 12/21/2015, [facility] was last seen by my nurse practitioner  Ward Givens. He had at the time regular refills of his medications and reported one seizure in the 6 months preceding the visit. Today he states that he had a seizure in June. Triggers are again not identified. He continues to take his medication as prescribed but overall is very happy with the effect of Vimpat and control of his epilepsy. He endorsed today 3 out of 15 points on her geriatric depression score but he is neither excessively daytime sleepy, not fatigued and overall is walking steadier since he had his hip surgery. Had no falls in the last 12 months.    REVIEW OF SYSTEMS: Out of a complete 14 system review of  symptoms, the patient complains only of the following symptoms, and all other reviewed systems are negative.  Drooling, another seizure on halloween eve. No alcohol involved.   ALLERGIES: Allergies  Allergen Reactions  . Aricept [Donepezil Hcl]   . Hydrocodone   . Ibuprofen   . Mesantoin [Mephenytoin]   . Tegretol [Carbamazepine]   . Topamax [Topiramate]   . Wellbutrin [Bupropion]   . Zonegran [Zonisamide]     HOME MEDICATIONS: Outpatient Medications Prior to Visit  Medication Sig Dispense Refill  . acetaminophen (TYLENOL) 325 MG tablet Take 650 mg by mouth 2 (two) times daily.    Marland Kitchen alendronate (FOSAMAX) 70 MG tablet     . Artificial Tear Ointment (ARTIFICIAL TEARS) ointment Place into both eyes 3 (three) times daily.    Marland Kitchen aspirin 81 MG tablet Take 81 mg by mouth daily.    . betamethasone dipropionate (DIPROLENE) 0.05 % cream Apply topically 2 (two) times daily.    . Calcium Carb-Cholecalciferol (OYSTER SHELL CALCIUM + D PO) Take 1 tablet by mouth daily.    . clotrimazole (LOTRIMIN) 1 % cream Apply 1 application topically 2 (two) times daily. Apply to groin for fungal infection    . desmopressin (DDAVP) 0.2 MG tablet Reported on 06/17/2015    . divalproex (DEPAKOTE) 250 MG DR tablet Take 1 tablet in the AM 2 tablets in the PM. 90 tablet 11  . finasteride (PROSCAR) 5 MG tablet     . lacosamide (VIMPAT) 200 MG TABS tablet Take 1 tablet (200 mg total) by mouth 2 (two) times daily. 60 tablet 5  . levothyroxine (SYNTHROID, LEVOTHROID) 100 MCG tablet 100 mcg daily before breakfast.     . linaclotide (LINZESS) 290 MCG CAPS capsule Take 290 mcg by mouth daily before breakfast.    . loperamide (IMODIUM) 2 MG capsule     . loratadine (CLARITIN) 10 MG tablet Take 10 mg by mouth 2 (two) times daily.    Marland Kitchen LORazepam (ATIVAN) 1 MG tablet Take 1 mg by mouth 3 (three) times daily as needed for seizure.    . Multiple Vitamin (MULTIVITAMIN) tablet Take 1 tablet by mouth daily.    Marland Kitchen nystatin cream  (MYCOSTATIN)     . omeprazole (PRILOSEC) 20 MG capsule     . tadalafil (CIALIS) 5 MG tablet Take 5 mg by mouth daily as needed for erectile dysfunction.     No facility-administered medications prior to visit.     PAST MEDICAL HISTORY: Past Medical History:  Diagnosis Date  . Aplastic anemia ([facility])   . Epilepsy ([place])   . Epilepsy without status epilepticus, not intractable ([place]) 06/04/2012  . Memory loss 06/04/2012  . Right hip pain 06/04/2012  . Tremor     PAST SURGICAL HISTORY: Past Surgical History:  Procedure Laterality Date  . CATARACT EXTRACTION    .  CRANIOTOMY Right    frontal    FAMILY HISTORY: Family History  Problem Relation Age of Onset  . Parkinson's disease Mother     versus lewy body dementia  . Stroke Maternal Grandmother   . Heart disease Maternal Grandmother   . Stroke Maternal Grandfather   . Heart disease Maternal Grandfather   . Stroke Paternal Grandmother   . Heart disease Paternal Grandmother   . Stroke Paternal Grandfather   . Heart disease Paternal Grandfather   . Diabetes Other   . Seizures Other     grand mal  . Mental retardation Cousin         PHYSICAL EXAM  Vitals:   12/21/15 1014  BP: (!) 148/77  Pulse: 95  Resp: 20  Weight: 196 lb (88.9 kg)  Height: 5\' 6"  (1.676 m)   Body mass index is 31.64 kg/m.  Generalized: Well developed, in no acute distress . Since hip surgery he is more alert, his memory improved - not on pain medications, sleeping better.   On his left wrist he had this morning a wart removed was cauterized. It was suspected to be a dysplastic nevus- it was very dark.  Neurological examination  Mentation: Alert oriented to time, place, history taking. Follows all commands speech and language fluent Cranial nerve:  No change in smell or taste . No olfactory aura.   Pupils were equal round reactive to light. Extraocular movements were full, visual field were full on confrontational test. Facial sensation and  strength were normal. Uvula tongue midline. Head turning and shoulder shrug  were symmetric. Motor: full  strength of all 4 extremities. The patient has normal motor tone without any evidence of cogwheeling, rigidity or tremor.  His leg movements are full strength.  Sensory: intact to primary modalities .  Coordination:  finger-nose-finger test bilaterally intact .  Gait and station: Gait is normal based . Tandem gait is improved - the patient is post hip replacement on the right side and he walks very steady and without any drift,    Romberg is negative.   Reflexes: Deep tendon reflexes are attenuated bilaterally.    DIAGNOSTIC DATA (LABS, IMAGING, TESTING) - I reviewed patient records, labs, notes, testing and imaging myself where available.  Lab Results  Component Value Date   WBC 6.2 06/17/2015   HGB 13.5 10/22/2013   HCT 42.3 06/17/2015   MCV 91 06/17/2015   PLT 201 06/17/2015      Component Value Date/Time   NA 139 06/17/2015 1139   K 4.7 06/17/2015 1139   CL 100 06/17/2015 1139   CO2 25 06/17/2015 1139   GLUCOSE 84 06/17/2015 1139   BUN 14 06/17/2015 1139   CREATININE 0.73 (L) 06/17/2015 1139   CALCIUM 10.1 06/17/2015 1139   PROT 7.2 06/17/2015 1139   ALBUMIN 4.5 06/17/2015 1139   AST 15 06/17/2015 1139   ALT 35 06/17/2015 1139   ALKPHOS 60 06/17/2015 1139   BILITOT 0.3 06/17/2015 1139   GFRNONAA 96 06/17/2015 1139   GFRAA 111 06/17/2015 1139      ASSESSMENT AND PLAN 70 y.o. year old male  has a past medical history of Aplastic anemia ([place]); Epilepsy ([place]); Epilepsy without status epilepticus, not intractable ([place]) (06/04/2012); Memory loss (06/04/2012); Right hip pain (06/04/2012); and Tremor. here for epilepsy follow up and refills of medications.    25 minute visit after a new seizure breakthrough June 6th 2017 , with more than 50% of the face to face time dedicated  to the seizure prevention , coordination of care and medication management. Last visit with NP was  on  07-07-2015.   1. Seizures, the patient's seizures have been better controlled on Vimpat but he is not seizure-free.  He suffered another breakthrough seizure this Halloween.  There have been no trigger factors identified for Halloween night the patient did not drink alcohol, he was not sleep deprived and he was not under stress anger or excitement. I will not change the dose of Vimpat. He continues to have a seizure 2 times a year. Before Vimpat he had 3 a month.   2. Gait instability is much improved after hip replacement surgery. The patient now walks narrow based and turns with 3 steps. He was able to do a tandem gait which was not possible over before.   3. Refilled Vimpat , depakote and LFT ordered.    Larey Seat, MD  12/21/2015, 10:27 AM [facility] [address] [place], [place] [address] [phone]   CC Dr  Dortha Kern, MD

## 2015-12-22 ENCOUNTER — Telehealth: Payer: Self-pay

## 2015-12-22 LAB — COMPREHENSIVE METABOLIC PANEL
ALK PHOS: 65 IU/L (ref 39–117)
ALT: 31 IU/L (ref 0–44)
AST: 19 IU/L (ref 0–40)
Albumin/Globulin Ratio: 1.5 (ref 1.2–2.2)
Albumin: 4.3 g/dL (ref 3.6–4.8)
BUN/Creatinine Ratio: 21 (ref 10–24)
BUN: 15 mg/dL (ref 8–27)
Bilirubin Total: <0.2 mg/dL (ref 0.0–1.2)
CALCIUM: 9.9 mg/dL (ref 8.6–10.2)
CO2: 27 mmol/L (ref 18–29)
Chloride: 102 mmol/L (ref 96–106)
Creatinine, Ser: 0.71 mg/dL — ABNORMAL LOW (ref 0.76–1.27)
GFR calc Af Amer: 111 mL/min/{1.73_m2} (ref >59)
GFR, EST NON AFRICAN AMERICAN: 96 mL/min/{1.73_m2} (ref >59)
GLUCOSE: 72 mg/dL (ref 65–99)
Globulin, Total: 2.9 g/dL (ref 1.5–4.5)
Potassium: 5.5 mmol/L — ABNORMAL HIGH (ref 3.5–5.2)
SODIUM: 141 mmol/L (ref 134–144)
Total Protein: 7.2 g/dL (ref 6.0–8.5)

## 2015-12-22 LAB — CBC
HEMATOCRIT: 39.3 % (ref 37.5–51.0)
HEMOGLOBIN: 13.3 g/dL (ref 12.6–17.7)
MCH: 31.1 pg (ref 26.6–33.0)
MCHC: 33.8 g/dL (ref 31.5–35.7)
MCV: 92 fL (ref 79–97)
PLATELETS: 188 10*3/uL (ref 150–379)
RBC: 4.27 x10E6/uL (ref 4.14–5.80)
RDW: 15.8 % — ABNORMAL HIGH (ref 12.3–15.4)
WBC: 7.2 10*3/uL (ref 3.4–10.8)

## 2015-12-22 NOTE — Telephone Encounter (Signed)
-----   Message from Larey Seat, MD sent at 12/22/2015 12:49 PM EST ----- Elevated potassium, not critical. Would like to share this result with your PCP/ would you like a copy by mail? . Normal CBC , CD

## 2015-12-22 NOTE — Telephone Encounter (Signed)
I spoke to [facility], pt's sister, per DPR, and advised her that pt's labs showed elevated potassium but not critical. Pt's daughter asked me to send this results to Dortha Kern, PA, pt's PCP. Pt's sister verbalized understanding of results. Pt had no questions at this time but was encouraged to call back if questions arise.

## 2015-12-24 DIAGNOSIS — F329 Major depressive disorder, single episode, unspecified: Secondary | ICD-10-CM | POA: Diagnosis not present

## 2015-12-24 DIAGNOSIS — F0280 Dementia in other diseases classified elsewhere without behavioral disturbance: Secondary | ICD-10-CM | POA: Diagnosis not present

## 2016-01-13 DIAGNOSIS — Z6832 Body mass index (BMI) 32.0-32.9, adult: Secondary | ICD-10-CM | POA: Diagnosis not present

## 2016-01-13 DIAGNOSIS — Z1389 Encounter for screening for other disorder: Secondary | ICD-10-CM | POA: Diagnosis not present

## 2016-01-13 DIAGNOSIS — H6693 Otitis media, unspecified, bilateral: Secondary | ICD-10-CM | POA: Diagnosis not present

## 2016-01-13 DIAGNOSIS — B079 Viral wart, unspecified: Secondary | ICD-10-CM | POA: Diagnosis not present

## 2016-01-27 DIAGNOSIS — L400 Psoriasis vulgaris: Secondary | ICD-10-CM | POA: Diagnosis not present

## 2016-01-27 DIAGNOSIS — L820 Inflamed seborrheic keratosis: Secondary | ICD-10-CM | POA: Diagnosis not present

## 2016-01-27 DIAGNOSIS — F329 Major depressive disorder, single episode, unspecified: Secondary | ICD-10-CM | POA: Diagnosis not present

## 2016-02-22 DIAGNOSIS — F329 Major depressive disorder, single episode, unspecified: Secondary | ICD-10-CM | POA: Diagnosis not present

## 2016-03-29 DIAGNOSIS — L400 Psoriasis vulgaris: Secondary | ICD-10-CM | POA: Diagnosis not present

## 2016-03-29 DIAGNOSIS — L578 Other skin changes due to chronic exposure to nonionizing radiation: Secondary | ICD-10-CM | POA: Diagnosis not present

## 2016-03-29 DIAGNOSIS — L570 Actinic keratosis: Secondary | ICD-10-CM | POA: Diagnosis not present

## 2016-04-06 DIAGNOSIS — H401131 Primary open-angle glaucoma, bilateral, mild stage: Secondary | ICD-10-CM | POA: Diagnosis not present

## 2016-04-07 DIAGNOSIS — B351 Tinea unguium: Secondary | ICD-10-CM | POA: Diagnosis not present

## 2016-04-07 DIAGNOSIS — L600 Ingrowing nail: Secondary | ICD-10-CM | POA: Diagnosis not present

## 2016-04-20 DIAGNOSIS — F329 Major depressive disorder, single episode, unspecified: Secondary | ICD-10-CM | POA: Diagnosis not present

## 2016-04-25 DIAGNOSIS — K219 Gastro-esophageal reflux disease without esophagitis: Secondary | ICD-10-CM | POA: Diagnosis not present

## 2016-04-25 DIAGNOSIS — E785 Hyperlipidemia, unspecified: Secondary | ICD-10-CM | POA: Diagnosis not present

## 2016-04-25 DIAGNOSIS — M6281 Muscle weakness (generalized): Secondary | ICD-10-CM | POA: Diagnosis not present

## 2016-04-25 DIAGNOSIS — E039 Hypothyroidism, unspecified: Secondary | ICD-10-CM | POA: Diagnosis not present

## 2016-05-03 DIAGNOSIS — Z139 Encounter for screening, unspecified: Secondary | ICD-10-CM | POA: Diagnosis not present

## 2016-05-03 DIAGNOSIS — G40309 Generalized idiopathic epilepsy and epileptic syndromes, not intractable, without status epilepticus: Secondary | ICD-10-CM | POA: Diagnosis not present

## 2016-05-03 DIAGNOSIS — Z1389 Encounter for screening for other disorder: Secondary | ICD-10-CM | POA: Diagnosis not present

## 2016-05-03 DIAGNOSIS — Z125 Encounter for screening for malignant neoplasm of prostate: Secondary | ICD-10-CM | POA: Diagnosis not present

## 2016-05-03 DIAGNOSIS — Z0000 Encounter for general adult medical examination without abnormal findings: Secondary | ICD-10-CM | POA: Diagnosis not present

## 2016-05-03 DIAGNOSIS — E782 Mixed hyperlipidemia: Secondary | ICD-10-CM | POA: Diagnosis not present

## 2016-05-03 DIAGNOSIS — E039 Hypothyroidism, unspecified: Secondary | ICD-10-CM | POA: Diagnosis not present

## 2016-05-03 DIAGNOSIS — Z79899 Other long term (current) drug therapy: Secondary | ICD-10-CM | POA: Diagnosis not present

## 2016-05-10 DIAGNOSIS — F329 Major depressive disorder, single episode, unspecified: Secondary | ICD-10-CM | POA: Diagnosis not present

## 2016-06-07 DIAGNOSIS — H6692 Otitis media, unspecified, left ear: Secondary | ICD-10-CM | POA: Diagnosis not present

## 2016-06-07 DIAGNOSIS — R42 Dizziness and giddiness: Secondary | ICD-10-CM | POA: Diagnosis not present

## 2016-06-07 DIAGNOSIS — Z720 Tobacco use: Secondary | ICD-10-CM | POA: Diagnosis not present

## 2016-06-07 DIAGNOSIS — R0989 Other specified symptoms and signs involving the circulatory and respiratory systems: Secondary | ICD-10-CM | POA: Diagnosis not present

## 2016-06-07 DIAGNOSIS — F329 Major depressive disorder, single episode, unspecified: Secondary | ICD-10-CM | POA: Diagnosis not present

## 2016-06-16 DIAGNOSIS — L600 Ingrowing nail: Secondary | ICD-10-CM | POA: Diagnosis not present

## 2016-06-16 DIAGNOSIS — B351 Tinea unguium: Secondary | ICD-10-CM | POA: Diagnosis not present

## 2016-06-20 ENCOUNTER — Ambulatory Visit: Payer: Medicare Other | Admitting: Adult Health

## 2016-06-28 DIAGNOSIS — R55 Syncope and collapse: Secondary | ICD-10-CM | POA: Diagnosis not present

## 2016-06-28 DIAGNOSIS — R42 Dizziness and giddiness: Secondary | ICD-10-CM | POA: Diagnosis not present

## 2016-06-28 DIAGNOSIS — Z6831 Body mass index (BMI) 31.0-31.9, adult: Secondary | ICD-10-CM | POA: Diagnosis not present

## 2016-06-29 DIAGNOSIS — R42 Dizziness and giddiness: Secondary | ICD-10-CM | POA: Diagnosis not present

## 2016-06-29 DIAGNOSIS — I6523 Occlusion and stenosis of bilateral carotid arteries: Secondary | ICD-10-CM | POA: Diagnosis not present

## 2016-07-05 DIAGNOSIS — N390 Urinary tract infection, site not specified: Secondary | ICD-10-CM | POA: Diagnosis not present

## 2016-07-06 DIAGNOSIS — F329 Major depressive disorder, single episode, unspecified: Secondary | ICD-10-CM | POA: Diagnosis not present

## 2016-07-06 DIAGNOSIS — F419 Anxiety disorder, unspecified: Secondary | ICD-10-CM | POA: Diagnosis not present

## 2016-07-19 DIAGNOSIS — Z6831 Body mass index (BMI) 31.0-31.9, adult: Secondary | ICD-10-CM | POA: Diagnosis not present

## 2016-07-19 DIAGNOSIS — R42 Dizziness and giddiness: Secondary | ICD-10-CM | POA: Diagnosis not present

## 2016-07-20 ENCOUNTER — Ambulatory Visit (INDEPENDENT_AMBULATORY_CARE_PROVIDER_SITE_OTHER): Payer: Medicare Other | Admitting: Adult Health

## 2016-07-20 ENCOUNTER — Encounter: Payer: Self-pay | Admitting: Adult Health

## 2016-07-20 VITALS — BP 127/73 | HR 82 | Ht 68.0 in | Wt 198.8 lb

## 2016-07-20 DIAGNOSIS — Z5181 Encounter for therapeutic drug level monitoring: Secondary | ICD-10-CM | POA: Diagnosis not present

## 2016-07-20 DIAGNOSIS — G40209 Localization-related (focal) (partial) symptomatic epilepsy and epileptic syndromes with complex partial seizures, not intractable, without status epilepticus: Secondary | ICD-10-CM

## 2016-07-20 MED ORDER — DIVALPROEX SODIUM 250 MG PO DR TAB: 500.0000 mg | DELAYED_RELEASE_TABLET | Freq: Two times a day (BID) | ORAL | 11 refills | Status: DC

## 2016-07-20 NOTE — Progress Notes (Signed)
PATIENT: [facility] DOB: [birth year]  REASON FOR VISIT: follow up HISTORY FROM: patient  HISTORY OF PRESENT ILLNESS: [name] is a 71 year old male with a history of seizures. He returns today for follow-up. He continues on Depakote 250 mg 1 tablet in the morning and 2 tablets at bedtime. He also is on Vimpat. He reports that he has not had any recent seizures. He reports that he had a seizure approximately one month after his last visit with Dr. Brett Fairy. However he does not remember if there was anything that triggered the event. He states that he never misses his medication. He continues to live at [facility]. He is able to complete all ADLs independently. He does not operate a motor vehicle. He reports episodes of dizziness that occurs approximately 3 times a day. It is not associated with positional changes. He states that the dizziness makes him feel as if he will have a seizure but the seizure never comes. He states that this is the same sensation he has gotten in the past prior to a seizure event. His primary care did a carotid Doppler that showed no hemodynamically significant ICA stenosis. He is also been referred to ENT for an evaluation. He returns today for an evaluation.   HISTORY 06/17/15: [name] is a 71 year old male with seizures. He returns today for follow-up. He continues to take Depakote and Vimpat. He is tolerating these medications well. He states that he has not had a seizure since Halloween. He continues to live at [facility]. He is able to complete all ADLs independently. He does not operate a motor vehicle. Denies any changes with her gait or balance. He denies any new neurological symptoms. He returns today for an evaluation.  HISTORY ([place]): [name] is a 71 year old caucasian male with a history of seizures.  He returns today for follow-up. He is currently taking Depakote and Vimpat. Patient states that he had two seizures in December. He states that he  has grand mal seizures. He denies missing any medication. He does state that his mom passed in December and he is not sure if the stress from that contributed to his seizures. He also has a roommate that keeps him up at night. He states that his roommate gets up and walks around during the night. He feels that he does not sleep well due to this. He goes to bed around midnight and wakes at 6:00 AM.  He currently lives at [facility] and reports that he has requested a new room mate but that so far as not happened.  Since [name] lives with a roommate at his facility he is dependent on the sleep cycles of others to yesterday for example he could only go to bed at about 4 AM and then slept until 7. He is here today for routine visit and refills. He had no seizures since the last 6 years. I have recommended to discontinue or wean off Wellbutrin as it lowered the seizure threshold. He had also used Chantix for smoking cessation. Family had 2 seizures in December , and his dacility still hesitated discontinuing that medication. His gait has not further changed. He feels stable. He is treated for epilepsy he is treated for glaucoma with Lumigan eyedrops, he has a history of falls his last fall was 18 months ago after his hip replacement. He is a much more stable gait since then. Patient has had 2 seizures in December. I had increased Vimpat to 200 mg BID.  I will check blood work. This will be completed at his facility since he has already taken his medication this morning. I have requested that his facility offer him a new room/ roommate so the patient can get better sleep.   REVIEW OF SYSTEMS: Out of a complete 14 system review of symptoms, the patient complains only of the following symptoms, and all other reviewed systems are negative.  Back pain, seizures, memory loss, bruise/bleed easily  ALLERGIES: Allergies  Allergen Reactions  . Aricept [Donepezil Hcl]   . Hydrocodone   . Ibuprofen   .  Mesantoin [Mephenytoin]   . Tegretol [Carbamazepine]   . Topamax [Topiramate]   . Wellbutrin [Bupropion]   . Zonegran [Zonisamide]     HOME MEDICATIONS: Outpatient Medications Prior to Visit  Medication Sig Dispense Refill  . alendronate (FOSAMAX) 70 MG tablet     . Artificial Tear Ointment (ARTIFICIAL TEARS) ointment Place into both eyes 3 (three) times daily.    Marland Kitchen aspirin 81 MG tablet Take 81 mg by mouth daily.    . betamethasone dipropionate (DIPROLENE) 0.05 % cream Apply topically 2 (two) times daily.    . Calcium Carb-Cholecalciferol (OYSTER SHELL CALCIUM + D PO) Take 1 tablet by mouth daily.    . clotrimazole (LOTRIMIN) 1 % cream Apply 1 application topically 2 (two) times daily. Apply to groin for fungal infection    . desmopressin (DDAVP) 0.2 MG tablet Reported on 06/17/2015    . divalproex (DEPAKOTE) 250 MG DR tablet Take 1 tablet in the AM 2 tablets in the PM. 90 tablet 11  . finasteride (PROSCAR) 5 MG tablet     . lacosamide (VIMPAT) 200 MG TABS tablet Take 1 tablet (200 mg total) by mouth 2 (two) times daily. 60 tablet 5  . levothyroxine (SYNTHROID, LEVOTHROID) 100 MCG tablet 100 mcg daily before breakfast.     . linaclotide (LINZESS) 290 MCG CAPS capsule Take 290 mcg by mouth daily before breakfast.    . loperamide (IMODIUM) 2 MG capsule     . loratadine (CLARITIN) 10 MG tablet Take 10 mg by mouth 2 (two) times daily.    Marland Kitchen LORazepam (ATIVAN) 1 MG tablet Take 1 mg by mouth 3 (three) times daily as needed for seizure.    . Multiple Vitamin (MULTIVITAMIN) tablet Take 1 tablet by mouth daily.    Marland Kitchen nystatin cream (MYCOSTATIN)     . omeprazole (PRILOSEC) 20 MG capsule     . tadalafil (CIALIS) 5 MG tablet Take 5 mg by mouth daily as needed for erectile dysfunction.     No facility-administered medications prior to visit.     PAST MEDICAL HISTORY: Past Medical History:  Diagnosis Date  . Aplastic anemia ([place])   . Epilepsy ([place])   . Epilepsy without status epilepticus, not  intractable ([place]) 06/04/2012  . Memory loss 06/04/2012  . Right hip pain 06/04/2012  . Tremor     PAST SURGICAL HISTORY: Past Surgical History:  Procedure Laterality Date  . CATARACT EXTRACTION    . CRANIOTOMY Right    frontal    FAMILY HISTORY: Family History  Problem Relation Age of Onset  . Parkinson's disease Mother        versus lewy body dementia  . Stroke Maternal Grandmother   . Heart disease Maternal Grandmother   . Stroke Maternal Grandfather   . Heart disease Maternal Grandfather   . Stroke Paternal Grandmother   . Heart disease Paternal Grandmother   . Stroke Paternal Grandfather   .  Heart disease Paternal Grandfather   . Diabetes Other   . Seizures Other        grand mal  . Mental retardation Cousin     SOCIAL HISTORY: Social History   Social History  . Marital status: Single    Spouse name: N/A  . Number of children: 0  . Years of education: college   Occupational History  . waiter     resides in [place] History Main Topics  . Smoking status: Current Every Day Smoker    Packs/day: 0.50    Years: 50.00    Types: Cigarettes  . Smokeless tobacco: Former Systems developer    Quit date: 06/19/2012  . Alcohol use No  . Drug use: No  . Sexual activity: Not on file   Other Topics Concern  . Not on file   Social History Narrative   Patient is single and lives at [facility] [place].    Patient does not have any children.   Patient is retired.   Patient has some college education.   Patient is right-handed.   Patient drinks four cups of coffee daily, one cup of soda daily.      PHYSICAL EXAM  Vitals:   07/20/16 0813  BP: 127/73  Pulse: 82  Weight: 198 lb 12.8 oz (90.2 kg)  Height: 5\' 8"  (1.727 m)   Body mass index is 30.23 kg/m.  Generalized: Well developed, in no acute distress   Neurological examination  Mentation: Alert oriented to time, place, history taking. Follows all commands speech and language fluent Cranial  nerve II-XII: Pupils were equal round reactive to light. Extraocular movements were full, visual field were full on confrontational test. Facial sensation and strength were normal. Uvula tongue midline. Head turning and shoulder shrug  were normal and symmetric. Motor: The motor testing reveals 5 over 5 strength of all 4 extremities. Good symmetric motor tone is noted throughout.  Sensory: Sensory testing is intact to soft touch on all 4 extremities. No evidence of extinction is noted.  Coordination: Cerebellar testing reveals good finger-nose-finger and heel-to-shin bilaterally.  Gait and station: Gait is normal. Tandem gait is normal. Romberg is negative. No drift is seen.  Reflexes: Deep tendon reflexes are symmetric and normal bilaterally.   DIAGNOSTIC DATA (LABS, IMAGING, TESTING) - I reviewed patient records, labs, notes, testing and imaging myself where available.  Lab Results  Component Value Date   WBC 7.2 12/21/2015   HGB 13.5 10/22/2013   HCT 39.3 12/21/2015   MCV 92 12/21/2015   PLT 188 12/21/2015      Component Value Date/Time   NA 141 12/21/2015 1125   K 5.5 (H) 12/21/2015 1125   CL 102 12/21/2015 1125   CO2 27 12/21/2015 1125   GLUCOSE 72 12/21/2015 1125   BUN 15 12/21/2015 1125   CREATININE 0.71 (L) 12/21/2015 1125   CALCIUM 9.9 12/21/2015 1125   PROT 7.2 12/21/2015 1125   ALBUMIN 4.3 12/21/2015 1125   AST 19 12/21/2015 1125   ALT 31 12/21/2015 1125   ALKPHOS 65 12/21/2015 1125   BILITOT <0.2 12/21/2015 1125   GFRNONAA 96 12/21/2015 1125   GFRAA 111 12/21/2015 1125      ASSESSMENT AND PLAN 71 y.o. year old male  has a past medical history of Aplastic anemia ([place]); Epilepsy ([place]); Epilepsy without status epilepticus, not intractable ([place]) (06/04/2012); Memory loss (06/04/2012); Right hip pain (06/04/2012); and Tremor. here with:  1. Seizures  The patient had  a seizure event in December and then has been having episodes of dizziness which she reports has  precipitated seizures in the past. I will check blood work today. I will increase Depakote to 500 mg twice a day. He will continue on Vimpat. Advised that if he has any additional seizure events he should let us know. If the dizziness does not resolve he should let us know or make his primary care provider aware. He will follow-up in 6 months with Dr. Brett Fairy  I spent 15 minutes with the patient 50% of this time was spent discussing his seizure events.   Ward Givens, MSN, NP-C 07/20/2016, 8:12 AM [facility] [address], [place], [place] [address] [phone]

## 2016-07-20 NOTE — Progress Notes (Signed)
I agree with the assessment and plan as directed by NP .The patient is known to me .   Shloimy Michalski, MD  

## 2016-07-20 NOTE — Patient Instructions (Signed)
Continue Vimpat Increase Depakote 250 mg to two tablets twice a day Blood work today If your symptoms worsen or you develop new symptoms please let us know.

## 2016-07-21 LAB — COMPREHENSIVE METABOLIC PANEL
A/G RATIO: 1.3 (ref 1.2–2.2)
ALT: 22 IU/L (ref 0–44)
AST: 14 IU/L (ref 0–40)
Albumin: 3.9 g/dL (ref 3.6–4.8)
Alkaline Phosphatase: 65 IU/L (ref 39–117)
BUN/Creatinine Ratio: 11 (ref 10–24)
BUN: 9 mg/dL (ref 8–27)
Bilirubin Total: 0.2 mg/dL (ref 0.0–1.2)
CALCIUM: 9.3 mg/dL (ref 8.6–10.2)
CO2: 27 mmol/L (ref 18–29)
CREATININE: 0.8 mg/dL (ref 0.76–1.27)
Chloride: 105 mmol/L (ref 96–106)
GFR calc non Af Amer: 92 mL/min/{1.73_m2} (ref >59)
GFR, EST AFRICAN AMERICAN: 106 mL/min/{1.73_m2} (ref >59)
Globulin, Total: 2.9 g/dL (ref 1.5–4.5)
Glucose: 98 mg/dL (ref 65–99)
POTASSIUM: 5.2 mmol/L (ref 3.5–5.2)
Sodium: 144 mmol/L (ref 134–144)
Total Protein: 6.8 g/dL (ref 6.0–8.5)

## 2016-07-21 LAB — CBC WITH DIFFERENTIAL/PLATELET
BASOS: 1 %
Basophils Absolute: 0 10*3/uL (ref 0.0–0.2)
EOS (ABSOLUTE): 0 10*3/uL (ref 0.0–0.4)
EOS: 1 %
HEMOGLOBIN: 12.8 g/dL — AB (ref 13.0–17.7)
Hematocrit: 39.3 % (ref 37.5–51.0)
IMMATURE GRANS (ABS): 0 10*3/uL (ref 0.0–0.1)
Immature Granulocytes: 0 %
LYMPHS: 36 %
Lymphocytes Absolute: 2 10*3/uL (ref 0.7–3.1)
MCH: 30.5 pg (ref 26.6–33.0)
MCHC: 32.6 g/dL (ref 31.5–35.7)
MCV: 94 fL (ref 79–97)
Monocytes Absolute: 0.5 10*3/uL (ref 0.1–0.9)
Monocytes: 10 %
NEUTROS ABS: 3 10*3/uL (ref 1.4–7.0)
Neutrophils: 52 %
Platelets: 176 10*3/uL (ref 150–379)
RBC: 4.2 x10E6/uL (ref 4.14–5.80)
RDW: 16.5 % — ABNORMAL HIGH (ref 12.3–15.4)
WBC: 5.6 10*3/uL (ref 3.4–10.8)

## 2016-07-21 LAB — VALPROIC ACID LEVEL: Valproic Acid Lvl: 49 ug/mL — ABNORMAL LOW (ref 50–100)

## 2016-07-25 ENCOUNTER — Telehealth: Payer: Self-pay | Admitting: *Deleted

## 2016-07-25 NOTE — Telephone Encounter (Signed)
Called and spoke with sister (POA) and advised her of unremarkable labs. She verbalized understanding. She is wondering if patient should still increase depakote dose as discussed. She stated MM,NP said it would depend on lab work. I placed patient on hold to speak with MM,NP. Unable to speak with Megan at time of call. Advised I will send message to her and call back to advise. She verbalized understanding.    Spoke with MM,NP. She stated that previously, she informed sister that if his depakote levels came back increased, they would decrease the dose. He is to take 250mg  tablets (2 tablets twice daily). Rx already given to bring to facility.

## 2016-07-25 NOTE — Telephone Encounter (Signed)
Called and spoke with [facility]. Relayed per MM,NP he is to continue to take 250mg  tablets (2 tablets twice daily). She verbalized understanding and has no further questions at this time.

## 2016-07-25 NOTE — Telephone Encounter (Signed)
-----   Message from Ward Givens, NP sent at 07/25/2016  7:21 AM EDT ----- Lab work unremarkable. Please call patient.

## 2016-07-26 DIAGNOSIS — G40909 Epilepsy, unspecified, not intractable, without status epilepticus: Secondary | ICD-10-CM | POA: Diagnosis not present

## 2016-07-26 DIAGNOSIS — E039 Hypothyroidism, unspecified: Secondary | ICD-10-CM | POA: Diagnosis not present

## 2016-07-26 DIAGNOSIS — J449 Chronic obstructive pulmonary disease, unspecified: Secondary | ICD-10-CM | POA: Diagnosis not present

## 2016-07-26 DIAGNOSIS — R42 Dizziness and giddiness: Secondary | ICD-10-CM | POA: Diagnosis not present

## 2016-07-26 DIAGNOSIS — H9042 Sensorineural hearing loss, unilateral, left ear, with unrestricted hearing on the contralateral side: Secondary | ICD-10-CM | POA: Diagnosis not present

## 2016-07-28 DIAGNOSIS — F329 Major depressive disorder, single episode, unspecified: Secondary | ICD-10-CM | POA: Diagnosis not present

## 2016-07-28 DIAGNOSIS — F419 Anxiety disorder, unspecified: Secondary | ICD-10-CM | POA: Diagnosis not present

## 2016-08-15 DIAGNOSIS — F419 Anxiety disorder, unspecified: Secondary | ICD-10-CM | POA: Diagnosis not present

## 2016-08-15 DIAGNOSIS — F329 Major depressive disorder, single episode, unspecified: Secondary | ICD-10-CM | POA: Diagnosis not present

## 2016-08-30 DIAGNOSIS — M79609 Pain in unspecified limb: Secondary | ICD-10-CM | POA: Diagnosis not present

## 2016-08-30 DIAGNOSIS — I10 Essential (primary) hypertension: Secondary | ICD-10-CM | POA: Diagnosis not present

## 2016-08-30 DIAGNOSIS — R42 Dizziness and giddiness: Secondary | ICD-10-CM | POA: Diagnosis not present

## 2016-08-30 DIAGNOSIS — Z139 Encounter for screening, unspecified: Secondary | ICD-10-CM | POA: Diagnosis not present

## 2016-08-30 DIAGNOSIS — J302 Other seasonal allergic rhinitis: Secondary | ICD-10-CM | POA: Diagnosis not present

## 2016-08-30 DIAGNOSIS — Z1379 Encounter for other screening for genetic and chromosomal anomalies: Secondary | ICD-10-CM | POA: Diagnosis not present

## 2016-08-30 DIAGNOSIS — Z6832 Body mass index (BMI) 32.0-32.9, adult: Secondary | ICD-10-CM | POA: Diagnosis not present

## 2016-08-30 DIAGNOSIS — F419 Anxiety disorder, unspecified: Secondary | ICD-10-CM | POA: Diagnosis not present

## 2016-08-30 DIAGNOSIS — F3160 Bipolar disorder, current episode mixed, unspecified: Secondary | ICD-10-CM | POA: Diagnosis not present

## 2016-08-30 DIAGNOSIS — J305 Allergic rhinitis due to food: Secondary | ICD-10-CM | POA: Diagnosis not present

## 2016-08-31 DIAGNOSIS — J302 Other seasonal allergic rhinitis: Secondary | ICD-10-CM | POA: Diagnosis not present

## 2016-08-31 DIAGNOSIS — J305 Allergic rhinitis due to food: Secondary | ICD-10-CM | POA: Diagnosis not present

## 2016-09-01 DIAGNOSIS — J305 Allergic rhinitis due to food: Secondary | ICD-10-CM | POA: Diagnosis not present

## 2016-09-01 DIAGNOSIS — J302 Other seasonal allergic rhinitis: Secondary | ICD-10-CM | POA: Diagnosis not present

## 2016-09-02 DIAGNOSIS — J302 Other seasonal allergic rhinitis: Secondary | ICD-10-CM | POA: Diagnosis not present

## 2016-09-02 DIAGNOSIS — J305 Allergic rhinitis due to food: Secondary | ICD-10-CM | POA: Diagnosis not present

## 2016-09-05 DIAGNOSIS — J305 Allergic rhinitis due to food: Secondary | ICD-10-CM | POA: Diagnosis not present

## 2016-09-05 DIAGNOSIS — J302 Other seasonal allergic rhinitis: Secondary | ICD-10-CM | POA: Diagnosis not present

## 2016-09-06 DIAGNOSIS — E039 Hypothyroidism, unspecified: Secondary | ICD-10-CM | POA: Diagnosis not present

## 2016-09-06 DIAGNOSIS — J305 Allergic rhinitis due to food: Secondary | ICD-10-CM | POA: Diagnosis not present

## 2016-09-06 DIAGNOSIS — Z79899 Other long term (current) drug therapy: Secondary | ICD-10-CM | POA: Diagnosis not present

## 2016-09-06 DIAGNOSIS — J302 Other seasonal allergic rhinitis: Secondary | ICD-10-CM | POA: Diagnosis not present

## 2016-09-06 DIAGNOSIS — E782 Mixed hyperlipidemia: Secondary | ICD-10-CM | POA: Diagnosis not present

## 2016-09-07 DIAGNOSIS — J302 Other seasonal allergic rhinitis: Secondary | ICD-10-CM | POA: Diagnosis not present

## 2016-09-07 DIAGNOSIS — J305 Allergic rhinitis due to food: Secondary | ICD-10-CM | POA: Diagnosis not present

## 2016-09-22 DIAGNOSIS — F0280 Dementia in other diseases classified elsewhere without behavioral disturbance: Secondary | ICD-10-CM | POA: Diagnosis not present

## 2016-09-22 DIAGNOSIS — F419 Anxiety disorder, unspecified: Secondary | ICD-10-CM | POA: Diagnosis not present

## 2016-09-22 DIAGNOSIS — F329 Major depressive disorder, single episode, unspecified: Secondary | ICD-10-CM | POA: Diagnosis not present

## 2016-10-03 DIAGNOSIS — E039 Hypothyroidism, unspecified: Secondary | ICD-10-CM | POA: Diagnosis not present

## 2016-10-03 DIAGNOSIS — K219 Gastro-esophageal reflux disease without esophagitis: Secondary | ICD-10-CM | POA: Diagnosis not present

## 2016-10-03 DIAGNOSIS — G40802 Other epilepsy, not intractable, without status epilepticus: Secondary | ICD-10-CM | POA: Diagnosis not present

## 2016-10-03 DIAGNOSIS — L408 Other psoriasis: Secondary | ICD-10-CM | POA: Diagnosis not present

## 2016-10-05 DIAGNOSIS — H401131 Primary open-angle glaucoma, bilateral, mild stage: Secondary | ICD-10-CM | POA: Diagnosis not present

## 2016-10-11 DIAGNOSIS — L578 Other skin changes due to chronic exposure to nonionizing radiation: Secondary | ICD-10-CM | POA: Diagnosis not present

## 2016-10-11 DIAGNOSIS — L570 Actinic keratosis: Secondary | ICD-10-CM | POA: Diagnosis not present

## 2016-10-11 DIAGNOSIS — L400 Psoriasis vulgaris: Secondary | ICD-10-CM | POA: Diagnosis not present

## 2016-10-11 DIAGNOSIS — L821 Other seborrheic keratosis: Secondary | ICD-10-CM | POA: Diagnosis not present

## 2016-10-12 DIAGNOSIS — R42 Dizziness and giddiness: Secondary | ICD-10-CM | POA: Diagnosis not present

## 2016-10-19 DIAGNOSIS — M810 Age-related osteoporosis without current pathological fracture: Secondary | ICD-10-CM | POA: Diagnosis not present

## 2016-10-19 DIAGNOSIS — G40802 Other epilepsy, not intractable, without status epilepticus: Secondary | ICD-10-CM | POA: Diagnosis not present

## 2016-10-19 DIAGNOSIS — Z7982 Long term (current) use of aspirin: Secondary | ICD-10-CM | POA: Diagnosis not present

## 2016-10-19 DIAGNOSIS — Z96641 Presence of right artificial hip joint: Secondary | ICD-10-CM | POA: Diagnosis not present

## 2016-10-19 DIAGNOSIS — H409 Unspecified glaucoma: Secondary | ICD-10-CM | POA: Diagnosis not present

## 2016-10-19 DIAGNOSIS — H8110 Benign paroxysmal vertigo, unspecified ear: Secondary | ICD-10-CM | POA: Diagnosis not present

## 2016-10-25 DIAGNOSIS — H8110 Benign paroxysmal vertigo, unspecified ear: Secondary | ICD-10-CM | POA: Diagnosis not present

## 2016-10-25 DIAGNOSIS — H409 Unspecified glaucoma: Secondary | ICD-10-CM | POA: Diagnosis not present

## 2016-10-25 DIAGNOSIS — M810 Age-related osteoporosis without current pathological fracture: Secondary | ICD-10-CM | POA: Diagnosis not present

## 2016-10-25 DIAGNOSIS — G40802 Other epilepsy, not intractable, without status epilepticus: Secondary | ICD-10-CM | POA: Diagnosis not present

## 2016-10-25 DIAGNOSIS — Z7982 Long term (current) use of aspirin: Secondary | ICD-10-CM | POA: Diagnosis not present

## 2016-10-25 DIAGNOSIS — Z96641 Presence of right artificial hip joint: Secondary | ICD-10-CM | POA: Diagnosis not present

## 2016-10-31 DIAGNOSIS — N400 Enlarged prostate without lower urinary tract symptoms: Secondary | ICD-10-CM | POA: Diagnosis not present

## 2016-10-31 DIAGNOSIS — E039 Hypothyroidism, unspecified: Secondary | ICD-10-CM | POA: Diagnosis not present

## 2016-10-31 DIAGNOSIS — H8110 Benign paroxysmal vertigo, unspecified ear: Secondary | ICD-10-CM | POA: Diagnosis not present

## 2016-10-31 DIAGNOSIS — G40802 Other epilepsy, not intractable, without status epilepticus: Secondary | ICD-10-CM | POA: Diagnosis not present

## 2016-11-01 DIAGNOSIS — F329 Major depressive disorder, single episode, unspecified: Secondary | ICD-10-CM | POA: Diagnosis not present

## 2016-11-01 DIAGNOSIS — F419 Anxiety disorder, unspecified: Secondary | ICD-10-CM | POA: Diagnosis not present

## 2016-11-02 DIAGNOSIS — Z7982 Long term (current) use of aspirin: Secondary | ICD-10-CM | POA: Diagnosis not present

## 2016-11-02 DIAGNOSIS — Z96641 Presence of right artificial hip joint: Secondary | ICD-10-CM | POA: Diagnosis not present

## 2016-11-02 DIAGNOSIS — G40802 Other epilepsy, not intractable, without status epilepticus: Secondary | ICD-10-CM | POA: Diagnosis not present

## 2016-11-02 DIAGNOSIS — H409 Unspecified glaucoma: Secondary | ICD-10-CM | POA: Diagnosis not present

## 2016-11-02 DIAGNOSIS — H8110 Benign paroxysmal vertigo, unspecified ear: Secondary | ICD-10-CM | POA: Diagnosis not present

## 2016-11-02 DIAGNOSIS — M810 Age-related osteoporosis without current pathological fracture: Secondary | ICD-10-CM | POA: Diagnosis not present

## 2016-11-17 DIAGNOSIS — G40802 Other epilepsy, not intractable, without status epilepticus: Secondary | ICD-10-CM | POA: Diagnosis not present

## 2016-11-17 DIAGNOSIS — H409 Unspecified glaucoma: Secondary | ICD-10-CM | POA: Diagnosis not present

## 2016-11-17 DIAGNOSIS — H8110 Benign paroxysmal vertigo, unspecified ear: Secondary | ICD-10-CM | POA: Diagnosis not present

## 2016-11-17 DIAGNOSIS — M810 Age-related osteoporosis without current pathological fracture: Secondary | ICD-10-CM | POA: Diagnosis not present

## 2016-11-17 DIAGNOSIS — Z7982 Long term (current) use of aspirin: Secondary | ICD-10-CM | POA: Diagnosis not present

## 2016-11-17 DIAGNOSIS — Z96641 Presence of right artificial hip joint: Secondary | ICD-10-CM | POA: Diagnosis not present

## 2016-11-28 DIAGNOSIS — G40802 Other epilepsy, not intractable, without status epilepticus: Secondary | ICD-10-CM | POA: Diagnosis not present

## 2016-11-28 DIAGNOSIS — F17210 Nicotine dependence, cigarettes, uncomplicated: Secondary | ICD-10-CM | POA: Diagnosis not present

## 2016-11-28 DIAGNOSIS — L408 Other psoriasis: Secondary | ICD-10-CM | POA: Diagnosis not present

## 2016-11-28 DIAGNOSIS — F0280 Dementia in other diseases classified elsewhere without behavioral disturbance: Secondary | ICD-10-CM | POA: Diagnosis not present

## 2016-11-30 DIAGNOSIS — H409 Unspecified glaucoma: Secondary | ICD-10-CM | POA: Diagnosis not present

## 2016-11-30 DIAGNOSIS — Z7982 Long term (current) use of aspirin: Secondary | ICD-10-CM | POA: Diagnosis not present

## 2016-11-30 DIAGNOSIS — H8110 Benign paroxysmal vertigo, unspecified ear: Secondary | ICD-10-CM | POA: Diagnosis not present

## 2016-11-30 DIAGNOSIS — Z96641 Presence of right artificial hip joint: Secondary | ICD-10-CM | POA: Diagnosis not present

## 2016-11-30 DIAGNOSIS — G40802 Other epilepsy, not intractable, without status epilepticus: Secondary | ICD-10-CM | POA: Diagnosis not present

## 2016-11-30 DIAGNOSIS — M810 Age-related osteoporosis without current pathological fracture: Secondary | ICD-10-CM | POA: Diagnosis not present

## 2016-12-01 DIAGNOSIS — Z23 Encounter for immunization: Secondary | ICD-10-CM | POA: Diagnosis not present

## 2016-12-06 DIAGNOSIS — L400 Psoriasis vulgaris: Secondary | ICD-10-CM | POA: Diagnosis not present

## 2016-12-06 DIAGNOSIS — F329 Major depressive disorder, single episode, unspecified: Secondary | ICD-10-CM | POA: Diagnosis not present

## 2016-12-06 DIAGNOSIS — L820 Inflamed seborrheic keratosis: Secondary | ICD-10-CM | POA: Diagnosis not present

## 2016-12-06 DIAGNOSIS — F419 Anxiety disorder, unspecified: Secondary | ICD-10-CM | POA: Diagnosis not present

## 2016-12-14 DIAGNOSIS — E559 Vitamin D deficiency, unspecified: Secondary | ICD-10-CM | POA: Diagnosis not present

## 2016-12-14 DIAGNOSIS — Z79899 Other long term (current) drug therapy: Secondary | ICD-10-CM | POA: Diagnosis not present

## 2016-12-14 DIAGNOSIS — D518 Other vitamin B12 deficiency anemias: Secondary | ICD-10-CM | POA: Diagnosis not present

## 2016-12-14 DIAGNOSIS — E7849 Other hyperlipidemia: Secondary | ICD-10-CM | POA: Diagnosis not present

## 2016-12-14 DIAGNOSIS — E119 Type 2 diabetes mellitus without complications: Secondary | ICD-10-CM | POA: Diagnosis not present

## 2016-12-14 DIAGNOSIS — E038 Other specified hypothyroidism: Secondary | ICD-10-CM | POA: Diagnosis not present

## 2016-12-20 DIAGNOSIS — F419 Anxiety disorder, unspecified: Secondary | ICD-10-CM | POA: Diagnosis not present

## 2016-12-20 DIAGNOSIS — F329 Major depressive disorder, single episode, unspecified: Secondary | ICD-10-CM | POA: Diagnosis not present

## 2016-12-26 DIAGNOSIS — E039 Hypothyroidism, unspecified: Secondary | ICD-10-CM | POA: Diagnosis not present

## 2016-12-26 DIAGNOSIS — G40802 Other epilepsy, not intractable, without status epilepticus: Secondary | ICD-10-CM | POA: Diagnosis not present

## 2016-12-26 DIAGNOSIS — H8110 Benign paroxysmal vertigo, unspecified ear: Secondary | ICD-10-CM | POA: Diagnosis not present

## 2016-12-26 DIAGNOSIS — N400 Enlarged prostate without lower urinary tract symptoms: Secondary | ICD-10-CM | POA: Diagnosis not present

## 2016-12-29 DIAGNOSIS — L84 Corns and callosities: Secondary | ICD-10-CM | POA: Diagnosis not present

## 2016-12-29 DIAGNOSIS — B351 Tinea unguium: Secondary | ICD-10-CM | POA: Diagnosis not present

## 2016-12-29 DIAGNOSIS — M79671 Pain in right foot: Secondary | ICD-10-CM | POA: Diagnosis not present

## 2017-01-13 ENCOUNTER — Ambulatory Visit (INDEPENDENT_AMBULATORY_CARE_PROVIDER_SITE_OTHER): Payer: Medicare Other | Admitting: Neurology

## 2017-01-13 ENCOUNTER — Encounter: Payer: Self-pay | Admitting: Neurology

## 2017-01-13 VITALS — BP 126/76 | HR 88 | Ht 66.0 in | Wt 201.0 lb

## 2017-01-13 DIAGNOSIS — G40209 Localization-related (focal) (partial) symptomatic epilepsy and epileptic syndromes with complex partial seizures, not intractable, without status epilepticus: Secondary | ICD-10-CM | POA: Diagnosis not present

## 2017-01-13 DIAGNOSIS — G40909 Epilepsy, unspecified, not intractable, without status epilepticus: Secondary | ICD-10-CM | POA: Diagnosis not present

## 2017-01-13 DIAGNOSIS — Z79899 Other long term (current) drug therapy: Secondary | ICD-10-CM

## 2017-01-13 DIAGNOSIS — M6281 Muscle weakness (generalized): Secondary | ICD-10-CM | POA: Diagnosis not present

## 2017-01-13 DIAGNOSIS — M150 Primary generalized (osteo)arthritis: Secondary | ICD-10-CM | POA: Diagnosis not present

## 2017-01-13 DIAGNOSIS — E039 Hypothyroidism, unspecified: Secondary | ICD-10-CM | POA: Diagnosis not present

## 2017-01-13 DIAGNOSIS — K219 Gastro-esophageal reflux disease without esophagitis: Secondary | ICD-10-CM | POA: Diagnosis not present

## 2017-01-13 MED ORDER — LACOSAMIDE 200 MG PO TABS: 200.0000 mg | ORAL_TABLET | Freq: Two times a day (BID) | ORAL | 3 refills | Status: DC

## 2017-01-13 MED ORDER — DIVALPROEX SODIUM 250 MG PO DR TAB: 500.0000 mg | DELAYED_RELEASE_TABLET | Freq: Two times a day (BID) | ORAL | 3 refills | Status: DC

## 2017-01-13 NOTE — Progress Notes (Signed)
PATIENT: Jordan Thomas DOB: [birth year]  REASON FOR VISIT: follow up HISTORY FROM: patient  HISTORY OF PRESENT ILLNESS:  Interval history from 13 January 2017.  I have the pleasure of meeting today with [name] and his sister. The patient has done exceptionally well on Vimpat and Depakote, is independent in ADLs, his last seizure spell was in December one year ago.  He did have Some complaint of vertigo over the last year which resolved with vestibular rehab.  He continues to take his medication regularly, residing at [facility].  He gets assistance with his oral medication.    [name] is a 71 year old male with a history of seizures. He returns today for follow-up. He continues on Depakote 250 mg 1 tablet in the morning and 2 tablets at bedtime. He also is on Vimpat. He reports that he has not had any recent seizures. He reports that he had a seizure approximately one month after his last visit with Dr. Brett Fairy. However he does not remember if there was anything that triggered the event. He states that he never misses his medication. He continues to live at [facility]. He is able to complete all ADLs independently. He does not operate a motor vehicle. He reports episodes of dizziness that occurs approximately 3 times a day. It is not associated with positional changes. He states that the dizziness makes him feel as if he will have a seizure but the seizure never comes. He states that this is the same sensation he has gotten in the past prior to a seizure event. His primary care did a carotid Doppler that showed no hemodynamically significant ICA stenosis. He is also been referred to ENT for an evaluation. He returns today for an evaluation.   HISTORY 06/17/15: [name] is a 71 year old male with seizures. He returns today for follow-up. He continues to take Depakote and Vimpat. He is tolerating these medications well. He states that he has not had a seizure since Halloween. He continues  to live at [facility]. He is able to complete all ADLs independently. He does not operate a motor vehicle. Denies any changes with her gait or balance. He denies any new neurological symptoms. He returns today for an evaluation.  HISTORY (Jordan Thomas): [name] is a 71 year old caucasian male with a history of seizures.  He returns today for follow-up. He is currently taking Depakote and Vimpat. Patient states that he had two seizures in December. He states that he has grand mal seizures. He denies missing any medication. He does state that his mom passed in December and he is not sure if the stress from that contributed to his seizures. He also has a roommate that keeps him up at night. He states that his roommate gets up and walks around during the night. He feels that he does not sleep well due to this. He goes to bed around midnight and wakes at 6:00 AM.  He currently lives at [facility] and reports that he has requested a new room mate but that so far as not happened.  Since [name] lives with a roommate at his facility he is dependent on the sleep cycles of others to yesterday for example he could only go to bed at about 4 AM and then slept until 7. He is here today for routine visit and refills. He had no seizures since the last 6 years. I have recommended to discontinue or wean off Wellbutrin as it lowered the seizure threshold. He had  also used Chantix for smoking cessation. Family had 2 seizures in December , and his dacility still hesitated discontinuing that medication. His gait has not further changed. He feels stable. He is treated for epilepsy he is treated for glaucoma with Lumigan eyedrops, he has a history of falls his last fall was 18 months ago after his hip replacement. He is a much more stable gait since then. Patient has had 2 seizures in December. I had increased Vimpat to 200 mg BID. I will check blood work. This will be completed at his facility since he has already taken  his medication this morning. I have requested that his facility offer him a new room/ roommate so the patient can get better sleep.   REVIEW OF SYSTEMS: Out of a complete 14 system review of symptoms, the patient complains only of the following symptoms, and all other reviewed systems are negative.  Vertigo - Back pain, seizures, bruise/bleed easily  ALLERGIES: Allergies  Allergen Reactions  . Aricept [Donepezil Hcl]   . Hydrocodone   . Ibuprofen   . Mesantoin [Mephenytoin]   . Tegretol [Carbamazepine]   . Topamax [Topiramate]   . Wellbutrin [Bupropion]   . Zonegran [Zonisamide]     HOME MEDICATIONS: Outpatient Medications Prior to Visit  Medication Sig Dispense Refill  . alendronate (FOSAMAX) 70 MG tablet Take 70 mg by mouth once a week.     . Artificial Tear Ointment (ARTIFICIAL TEARS) ointment Place into both eyes 3 (three) times daily.    Marland Kitchen aspirin 81 MG tablet Take 81 mg by mouth daily.    Marland Kitchen atorvastatin (LIPITOR) 10 MG tablet Take 10 mg by mouth. Every 48 hours    . bimatoprost (LUMIGAN) 0.01 % SOLN Place 1 drop into both eyes at bedtime.    . Calcium Carb-Cholecalciferol (OYSTER SHELL CALCIUM + D PO) Take 1 tablet by mouth daily.    . clotrimazole (LOTRIMIN) 1 % cream Apply 1 application topically 2 (two) times daily. Apply to groin for fungal infection    . divalproex (DEPAKOTE) 250 MG DR tablet Take 2 tablets (500 mg total) by mouth 2 (two) times daily. 120 tablet 11  . finasteride (PROSCAR) 5 MG tablet Take 5 mg by mouth daily.     Marland Kitchen lacosamide (VIMPAT) 200 MG TABS tablet Take 1 tablet (200 mg total) by mouth 2 (two) times daily. 60 tablet 5  . levothyroxine (SYNTHROID, LEVOTHROID) 100 MCG tablet 100 mcg daily before breakfast.     . linaclotide (LINZESS) 290 MCG CAPS capsule Take 290 mcg by mouth. Every 48 hours.    Marland Kitchen loperamide (IMODIUM) 2 MG capsule as needed.     . loratadine (CLARITIN) 10 MG tablet Take 10 mg by mouth 2 (two) times daily.    Marland Kitchen LORazepam (ATIVAN) 1  MG tablet Take 1 mg by mouth 3 (three) times daily as needed for seizure.    . nystatin cream (MYCOSTATIN)     . omeprazole (PRILOSEC) 20 MG capsule     . ondansetron (ZOFRAN) 4 MG tablet Take 4 mg by mouth every 6 (six) hours as needed for nausea or vomiting.    . tadalafil (CIALIS) 5 MG tablet Take 5 mg by mouth daily as needed for erectile dysfunction.    . tamsulosin (FLOMAX) 0.4 MG CAPS capsule Take 0.4 mg by mouth daily.    Marland Kitchen UNABLE TO FIND Med Name: Therapy M 9mg  -46mcg po daily    . betamethasone dipropionate (DIPROLENE) 0.05 % cream Apply topically 2 (  two) times daily.    Marland Kitchen desmopressin (DDAVP) 0.2 MG tablet Reported on 06/17/2015     No facility-administered medications prior to visit.     PAST MEDICAL HISTORY: Past Medical History:  Diagnosis Date  . Aplastic anemia ([place])   . Epilepsy ([place])   . 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 Memory loss 06/04/2012  . Right hip pain 06/04/2012  . Tremor     PAST SURGICAL HISTORY: Past Surgical History:  Procedure Laterality Date  . CATARACT EXTRACTION    . CRANIOTOMY Right    frontal    FAMILY HISTORY: Family History  Problem Relation Age of Onset  . Parkinson's disease Mother        versus lewy body dementia  . Stroke Maternal Grandmother   . Heart disease Maternal Grandmother   . Stroke Maternal Grandfather   . Heart disease Maternal Grandfather   . Stroke Paternal Grandmother   . Heart disease Paternal Grandmother   . Stroke Paternal Grandfather   . Heart disease Paternal Grandfather   . Diabetes Other   . Seizures Other        grand mal  . Mental retardation Cousin     SOCIAL HISTORY: Social History   Socioeconomic History  . Marital status: Single    Spouse name: Not on file  . Number of children: 0  . Years of education: college  . Highest education level: Not on file  Social Needs  . Financial resource strain: Not on file  . Food insecurity - worry: Not on file  .  Food insecurity - inability: Not on file  . Transportation needs - medical: Not on file  . Transportation needs - non-medical: Not on file  Occupational History  . Occupation: Doctor, general practice    Comment: resides in [place]  . Smoking status: Current Every Day Smoker    Packs/day: 0.50    Years: 50.00    Pack years: 25.00    Types: Cigarettes  . Smokeless tobacco: Former Systems developer    Quit date: 06/19/2012  Substance and Sexual Activity  . Alcohol use: No    Alcohol/week: 0.0 oz  . Drug use: No  . Sexual activity: Not on file  Other Topics Concern  . Not on file  Social History Narrative   Patient is single and lives at [facility].    Patient does not have any children.   Patient is retired.   Patient has some college education.   Patient is right-handed.   Patient drinks four cups of coffee daily, one cup of soda daily.      PHYSICAL EXAM  Vitals:   01/13/17 1014  BP: 126/76  Pulse: 88  Weight: 201 lb (91.2 kg)  Height: 5\' 6"  (1.676 m)   Body mass index is 32.44 kg/m.  Generalized: Well developed, in no acute distress   Neurological examination  Mentation: Alert oriented to time, place, history taking. Follows all commands speech and language fluent Cranial nerve : very near sided - right Pupil larger than left, but both reactive to light and accomodation.   Extraocular movements were full, visual field were full on confrontational test. Facial strength is normal. Uvula and tongue midline.  Head turning and shoulder shrug  were normal and symmetric. Motor:  reveals full 5 / 5 strength of all 4 extremities. Good symmetric motor tone is noted throughout.  intact good finger-nose-finger and heel-to-shin bilaterally.  Gait and station: Gait  is normal.  Tandem gait is normal. Romberg is negative ! No drift is seen.  Reflexes: Deep tendon reflexes are symmetric bilaterally.   DIAGNOSTIC DATA (LABS, IMAGING, TESTING) - I reviewed patient  records, labs, notes, testing and imaging myself where available.  Lab Results  Component Value Date   WBC 5.6 07/20/2016   HGB 12.8 (L) 07/20/2016   HCT 39.3 07/20/2016   MCV 94 07/20/2016   PLT 176 07/20/2016      Component Value Date/Time   NA 144 07/20/2016 0904   K 5.2 07/20/2016 0904   CL 105 07/20/2016 0904   CO2 27 07/20/2016 0904   GLUCOSE 98 07/20/2016 0904   BUN 9 07/20/2016 0904   CREATININE 0.80 07/20/2016 0904   CALCIUM 9.3 07/20/2016 0904   PROT 6.8 07/20/2016 0904   ALBUMIN 3.9 07/20/2016 0904   AST 14 07/20/2016 0904   ALT 22 07/20/2016 0904   ALKPHOS 65 07/20/2016 0904   BILITOT 0.2 07/20/2016 0904   GFRNONAA 92 07/20/2016 0904   GFRAA 106 07/20/2016 0904      ASSESSMENT AND PLAN 71 y.o. year old male  has a past medical history of Aplastic anemia ([place]), Epilepsy ([place]), Epilepsy without status epilepticus, not intractable ([place]) (06/04/2012), Memory loss (06/04/2012), Right hip pain (06/04/2012), and Tremor. here with follow up for controlled Epilepsy.    1. Seizures, continued following a brain surgery to treat epilepsy [facility]-  Dr Gloriann Loan ) . On Vimpat and Depakote and a former patient of Dr Erling Cruz.   The patient had a seizure event in December 2017 and none since then- he has been having episodes of dizziness ( vertigo )  which he reports have precipitated seizures in the past. He did well with vestibular therapy.  I will check blood work today again.  I will keep Depakote to 500 mg twice a day.  He will continue on Vimpat at same  dose- he feels that Vimpat made the difference.If the dizziness does return he should let us know or make his primary care provider aware. He will follow-up in 6 months with me, Dr. Brett Fairy  I spent 25 minutes with the patient 50% of this time was spent discussing his  Vertigo, seizure events.   Larey Seat, MD  01/13/2017, 10:22 AM [facility] [address], [place], [place] [address] [phone]

## 2017-01-13 NOTE — Patient Instructions (Signed)
Medication unchanged in dose, but prescription for 90 days with 3 refills  issued.  Rv in 12 month

## 2017-01-14 LAB — COMPREHENSIVE METABOLIC PANEL
ALT: 26 IU/L (ref 0–44)
AST: 15 IU/L (ref 0–40)
Albumin/Globulin Ratio: 1.3 (ref 1.2–2.2)
Albumin: 3.9 g/dL (ref 3.6–4.8)
Alkaline Phosphatase: 64 IU/L (ref 39–117)
BUN/Creatinine Ratio: 34 — ABNORMAL HIGH (ref 10–24)
BUN: 24 mg/dL (ref 8–27)
Bilirubin Total: 0.2 mg/dL (ref 0.0–1.2)
CALCIUM: 9.5 mg/dL (ref 8.6–10.2)
CO2: 26 mmol/L (ref 20–29)
CREATININE: 0.71 mg/dL — AB (ref 0.76–1.27)
Chloride: 102 mmol/L (ref 96–106)
GFR calc Af Amer: 111 mL/min/{1.73_m2} (ref >59)
GFR, EST NON AFRICAN AMERICAN: 96 mL/min/{1.73_m2} (ref >59)
GLOBULIN, TOTAL: 3.1 g/dL (ref 1.5–4.5)
Glucose: 85 mg/dL (ref 65–99)
Potassium: 4.9 mmol/L (ref 3.5–5.2)
Sodium: 142 mmol/L (ref 134–144)
TOTAL PROTEIN: 7 g/dL (ref 6.0–8.5)

## 2017-01-14 LAB — CBC
HEMATOCRIT: 38 % (ref 37.5–51.0)
HEMOGLOBIN: 12.7 g/dL — AB (ref 13.0–17.7)
MCH: 31.4 pg (ref 26.6–33.0)
MCHC: 33.4 g/dL (ref 31.5–35.7)
MCV: 94 fL (ref 79–97)
Platelets: 207 10*3/uL (ref 150–379)
RBC: 4.04 x10E6/uL — AB (ref 4.14–5.80)
RDW: 15.8 % — ABNORMAL HIGH (ref 12.3–15.4)
WBC: 7.8 10*3/uL (ref 3.4–10.8)

## 2017-01-16 ENCOUNTER — Telehealth: Payer: Self-pay

## 2017-01-16 NOTE — Telephone Encounter (Signed)
I left a detailed message with lab results, ok per dpr. I advised patient to call back with any questions or concerns.

## 2017-01-16 NOTE — Telephone Encounter (Signed)
-----   Message from Darleen Crocker, RN sent at 01/16/2017 10:52 AM EST ----- Please call this patient with normal labs if you are comfortable.  Thanks ----- Message ----- From: Larey Seat, MD Sent: 01/16/2017   8:43 AM To: Darleen Crocker, RN  All normal metabolic panel- there is not such thing as a too low creatinine - mild anemia again seen, first evident 6 month ago. Ask patient to take Vitamin B 12 and slow -iron supplement  po.   Cc : primary care physician, which is at his residential facility.

## 2017-01-19 ENCOUNTER — Ambulatory Visit: Payer: Medicare Other | Admitting: Neurology

## 2017-01-23 DIAGNOSIS — N400 Enlarged prostate without lower urinary tract symptoms: Secondary | ICD-10-CM | POA: Diagnosis not present

## 2017-01-23 DIAGNOSIS — G40802 Other epilepsy, not intractable, without status epilepticus: Secondary | ICD-10-CM | POA: Diagnosis not present

## 2017-01-23 DIAGNOSIS — K219 Gastro-esophageal reflux disease without esophagitis: Secondary | ICD-10-CM | POA: Diagnosis not present

## 2017-01-23 DIAGNOSIS — L408 Other psoriasis: Secondary | ICD-10-CM | POA: Diagnosis not present

## 2017-02-20 DIAGNOSIS — H409 Unspecified glaucoma: Secondary | ICD-10-CM | POA: Diagnosis not present

## 2017-02-20 DIAGNOSIS — N400 Enlarged prostate without lower urinary tract symptoms: Secondary | ICD-10-CM | POA: Diagnosis not present

## 2017-02-20 DIAGNOSIS — G40802 Other epilepsy, not intractable, without status epilepticus: Secondary | ICD-10-CM | POA: Diagnosis not present

## 2017-02-20 DIAGNOSIS — M810 Age-related osteoporosis without current pathological fracture: Secondary | ICD-10-CM | POA: Diagnosis not present

## 2017-02-21 DIAGNOSIS — F329 Major depressive disorder, single episode, unspecified: Secondary | ICD-10-CM | POA: Diagnosis not present

## 2017-02-21 DIAGNOSIS — F419 Anxiety disorder, unspecified: Secondary | ICD-10-CM | POA: Diagnosis not present

## 2017-02-21 DIAGNOSIS — F0280 Dementia in other diseases classified elsewhere without behavioral disturbance: Secondary | ICD-10-CM | POA: Diagnosis not present

## 2017-02-22 DIAGNOSIS — H401131 Primary open-angle glaucoma, bilateral, mild stage: Secondary | ICD-10-CM | POA: Diagnosis not present

## 2017-02-27 DIAGNOSIS — M5442 Lumbago with sciatica, left side: Secondary | ICD-10-CM | POA: Diagnosis not present

## 2017-03-08 DIAGNOSIS — L570 Actinic keratosis: Secondary | ICD-10-CM | POA: Diagnosis not present

## 2017-03-08 DIAGNOSIS — L400 Psoriasis vulgaris: Secondary | ICD-10-CM | POA: Diagnosis not present

## 2017-03-15 DIAGNOSIS — Z79899 Other long term (current) drug therapy: Secondary | ICD-10-CM | POA: Diagnosis not present

## 2017-03-15 DIAGNOSIS — E119 Type 2 diabetes mellitus without complications: Secondary | ICD-10-CM | POA: Diagnosis not present

## 2017-03-15 DIAGNOSIS — E559 Vitamin D deficiency, unspecified: Secondary | ICD-10-CM | POA: Diagnosis not present

## 2017-03-15 DIAGNOSIS — D518 Other vitamin B12 deficiency anemias: Secondary | ICD-10-CM | POA: Diagnosis not present

## 2017-03-15 DIAGNOSIS — E7849 Other hyperlipidemia: Secondary | ICD-10-CM | POA: Diagnosis not present

## 2017-03-15 DIAGNOSIS — E038 Other specified hypothyroidism: Secondary | ICD-10-CM | POA: Diagnosis not present

## 2017-03-20 DIAGNOSIS — K219 Gastro-esophageal reflux disease without esophagitis: Secondary | ICD-10-CM | POA: Diagnosis not present

## 2017-03-20 DIAGNOSIS — G40802 Other epilepsy, not intractable, without status epilepticus: Secondary | ICD-10-CM | POA: Diagnosis not present

## 2017-03-20 DIAGNOSIS — H409 Unspecified glaucoma: Secondary | ICD-10-CM | POA: Diagnosis not present

## 2017-03-20 DIAGNOSIS — L408 Other psoriasis: Secondary | ICD-10-CM | POA: Diagnosis not present

## 2017-04-04 DIAGNOSIS — F329 Major depressive disorder, single episode, unspecified: Secondary | ICD-10-CM | POA: Diagnosis not present

## 2017-04-04 DIAGNOSIS — F419 Anxiety disorder, unspecified: Secondary | ICD-10-CM | POA: Diagnosis not present

## 2017-04-05 DIAGNOSIS — Z20828 Contact with and (suspected) exposure to other viral communicable diseases: Secondary | ICD-10-CM | POA: Diagnosis not present

## 2017-04-05 DIAGNOSIS — Z79899 Other long term (current) drug therapy: Secondary | ICD-10-CM | POA: Diagnosis not present

## 2017-04-05 DIAGNOSIS — L84 Corns and callosities: Secondary | ICD-10-CM | POA: Diagnosis not present

## 2017-04-05 DIAGNOSIS — M79671 Pain in right foot: Secondary | ICD-10-CM | POA: Diagnosis not present

## 2017-04-05 DIAGNOSIS — I739 Peripheral vascular disease, unspecified: Secondary | ICD-10-CM | POA: Diagnosis not present

## 2017-04-05 DIAGNOSIS — B353 Tinea pedis: Secondary | ICD-10-CM | POA: Diagnosis not present

## 2017-04-17 DIAGNOSIS — E039 Hypothyroidism, unspecified: Secondary | ICD-10-CM | POA: Diagnosis not present

## 2017-04-17 DIAGNOSIS — N400 Enlarged prostate without lower urinary tract symptoms: Secondary | ICD-10-CM | POA: Diagnosis not present

## 2017-04-17 DIAGNOSIS — M810 Age-related osteoporosis without current pathological fracture: Secondary | ICD-10-CM | POA: Diagnosis not present

## 2017-04-17 DIAGNOSIS — E785 Hyperlipidemia, unspecified: Secondary | ICD-10-CM | POA: Diagnosis not present

## 2017-05-15 DIAGNOSIS — G40802 Other epilepsy, not intractable, without status epilepticus: Secondary | ICD-10-CM | POA: Diagnosis not present

## 2017-05-15 DIAGNOSIS — K219 Gastro-esophageal reflux disease without esophagitis: Secondary | ICD-10-CM | POA: Diagnosis not present

## 2017-05-15 DIAGNOSIS — F0280 Dementia in other diseases classified elsewhere without behavioral disturbance: Secondary | ICD-10-CM | POA: Diagnosis not present

## 2017-05-15 DIAGNOSIS — F17210 Nicotine dependence, cigarettes, uncomplicated: Secondary | ICD-10-CM | POA: Diagnosis not present

## 2017-05-23 DIAGNOSIS — F329 Major depressive disorder, single episode, unspecified: Secondary | ICD-10-CM | POA: Diagnosis not present

## 2017-05-23 DIAGNOSIS — F419 Anxiety disorder, unspecified: Secondary | ICD-10-CM | POA: Diagnosis not present

## 2017-06-07 DIAGNOSIS — E038 Other specified hypothyroidism: Secondary | ICD-10-CM | POA: Diagnosis not present

## 2017-06-07 DIAGNOSIS — E119 Type 2 diabetes mellitus without complications: Secondary | ICD-10-CM | POA: Diagnosis not present

## 2017-06-07 DIAGNOSIS — E559 Vitamin D deficiency, unspecified: Secondary | ICD-10-CM | POA: Diagnosis not present

## 2017-06-07 DIAGNOSIS — E7849 Other hyperlipidemia: Secondary | ICD-10-CM | POA: Diagnosis not present

## 2017-06-07 DIAGNOSIS — D518 Other vitamin B12 deficiency anemias: Secondary | ICD-10-CM | POA: Diagnosis not present

## 2017-06-07 DIAGNOSIS — Z79899 Other long term (current) drug therapy: Secondary | ICD-10-CM | POA: Diagnosis not present

## 2017-06-12 DIAGNOSIS — N400 Enlarged prostate without lower urinary tract symptoms: Secondary | ICD-10-CM | POA: Diagnosis not present

## 2017-06-12 DIAGNOSIS — M810 Age-related osteoporosis without current pathological fracture: Secondary | ICD-10-CM | POA: Diagnosis not present

## 2017-06-12 DIAGNOSIS — I739 Peripheral vascular disease, unspecified: Secondary | ICD-10-CM | POA: Diagnosis not present

## 2017-06-12 DIAGNOSIS — E785 Hyperlipidemia, unspecified: Secondary | ICD-10-CM | POA: Diagnosis not present

## 2017-06-13 DIAGNOSIS — F329 Major depressive disorder, single episode, unspecified: Secondary | ICD-10-CM | POA: Diagnosis not present

## 2017-06-13 DIAGNOSIS — L821 Other seborrheic keratosis: Secondary | ICD-10-CM | POA: Diagnosis not present

## 2017-06-13 DIAGNOSIS — L400 Psoriasis vulgaris: Secondary | ICD-10-CM | POA: Diagnosis not present

## 2017-06-13 DIAGNOSIS — F419 Anxiety disorder, unspecified: Secondary | ICD-10-CM | POA: Diagnosis not present

## 2017-06-30 DIAGNOSIS — M150 Primary generalized (osteo)arthritis: Secondary | ICD-10-CM | POA: Diagnosis not present

## 2017-06-30 DIAGNOSIS — F331 Major depressive disorder, recurrent, moderate: Secondary | ICD-10-CM | POA: Diagnosis not present

## 2017-06-30 DIAGNOSIS — E038 Other specified hypothyroidism: Secondary | ICD-10-CM | POA: Diagnosis not present

## 2017-06-30 DIAGNOSIS — M818 Other osteoporosis without current pathological fracture: Secondary | ICD-10-CM | POA: Diagnosis not present

## 2017-07-11 DIAGNOSIS — R0981 Nasal congestion: Secondary | ICD-10-CM | POA: Diagnosis not present

## 2017-07-11 DIAGNOSIS — F17210 Nicotine dependence, cigarettes, uncomplicated: Secondary | ICD-10-CM | POA: Diagnosis not present

## 2017-07-11 DIAGNOSIS — G40802 Other epilepsy, not intractable, without status epilepticus: Secondary | ICD-10-CM | POA: Diagnosis not present

## 2017-07-11 DIAGNOSIS — L408 Other psoriasis: Secondary | ICD-10-CM | POA: Diagnosis not present

## 2017-07-18 DIAGNOSIS — F419 Anxiety disorder, unspecified: Secondary | ICD-10-CM | POA: Diagnosis not present

## 2017-07-18 DIAGNOSIS — F329 Major depressive disorder, single episode, unspecified: Secondary | ICD-10-CM | POA: Diagnosis not present

## 2017-08-01 DIAGNOSIS — L400 Psoriasis vulgaris: Secondary | ICD-10-CM | POA: Diagnosis not present

## 2017-08-01 DIAGNOSIS — L570 Actinic keratosis: Secondary | ICD-10-CM | POA: Diagnosis not present

## 2017-08-03 DIAGNOSIS — M818 Other osteoporosis without current pathological fracture: Secondary | ICD-10-CM | POA: Diagnosis not present

## 2017-08-03 DIAGNOSIS — M150 Primary generalized (osteo)arthritis: Secondary | ICD-10-CM | POA: Diagnosis not present

## 2017-08-03 DIAGNOSIS — F331 Major depressive disorder, recurrent, moderate: Secondary | ICD-10-CM | POA: Diagnosis not present

## 2017-08-03 DIAGNOSIS — E038 Other specified hypothyroidism: Secondary | ICD-10-CM | POA: Diagnosis not present

## 2017-08-08 DIAGNOSIS — E039 Hypothyroidism, unspecified: Secondary | ICD-10-CM | POA: Diagnosis not present

## 2017-08-08 DIAGNOSIS — M810 Age-related osteoporosis without current pathological fracture: Secondary | ICD-10-CM | POA: Diagnosis not present

## 2017-08-08 DIAGNOSIS — N400 Enlarged prostate without lower urinary tract symptoms: Secondary | ICD-10-CM | POA: Diagnosis not present

## 2017-08-08 DIAGNOSIS — L408 Other psoriasis: Secondary | ICD-10-CM | POA: Diagnosis not present

## 2017-08-22 DIAGNOSIS — F329 Major depressive disorder, single episode, unspecified: Secondary | ICD-10-CM | POA: Diagnosis not present

## 2017-08-22 DIAGNOSIS — F419 Anxiety disorder, unspecified: Secondary | ICD-10-CM | POA: Diagnosis not present

## 2017-08-24 DIAGNOSIS — L84 Corns and callosities: Secondary | ICD-10-CM | POA: Diagnosis not present

## 2017-08-24 DIAGNOSIS — M79671 Pain in right foot: Secondary | ICD-10-CM | POA: Diagnosis not present

## 2017-08-24 DIAGNOSIS — B353 Tinea pedis: Secondary | ICD-10-CM | POA: Diagnosis not present

## 2017-08-24 DIAGNOSIS — I739 Peripheral vascular disease, unspecified: Secondary | ICD-10-CM | POA: Diagnosis not present

## 2017-08-30 DIAGNOSIS — E7849 Other hyperlipidemia: Secondary | ICD-10-CM | POA: Diagnosis not present

## 2017-08-30 DIAGNOSIS — E559 Vitamin D deficiency, unspecified: Secondary | ICD-10-CM | POA: Diagnosis not present

## 2017-08-30 DIAGNOSIS — D518 Other vitamin B12 deficiency anemias: Secondary | ICD-10-CM | POA: Diagnosis not present

## 2017-08-30 DIAGNOSIS — E119 Type 2 diabetes mellitus without complications: Secondary | ICD-10-CM | POA: Diagnosis not present

## 2017-08-30 DIAGNOSIS — E038 Other specified hypothyroidism: Secondary | ICD-10-CM | POA: Diagnosis not present

## 2017-08-30 DIAGNOSIS — Z79899 Other long term (current) drug therapy: Secondary | ICD-10-CM | POA: Diagnosis not present

## 2017-08-30 DIAGNOSIS — H401131 Primary open-angle glaucoma, bilateral, mild stage: Secondary | ICD-10-CM | POA: Diagnosis not present

## 2017-09-01 DIAGNOSIS — E038 Other specified hypothyroidism: Secondary | ICD-10-CM | POA: Diagnosis not present

## 2017-09-01 DIAGNOSIS — M818 Other osteoporosis without current pathological fracture: Secondary | ICD-10-CM | POA: Diagnosis not present

## 2017-09-01 DIAGNOSIS — M150 Primary generalized (osteo)arthritis: Secondary | ICD-10-CM | POA: Diagnosis not present

## 2017-09-01 DIAGNOSIS — F331 Major depressive disorder, recurrent, moderate: Secondary | ICD-10-CM | POA: Diagnosis not present

## 2017-09-05 DIAGNOSIS — G40802 Other epilepsy, not intractable, without status epilepticus: Secondary | ICD-10-CM | POA: Diagnosis not present

## 2017-09-05 DIAGNOSIS — F0280 Dementia in other diseases classified elsewhere without behavioral disturbance: Secondary | ICD-10-CM | POA: Diagnosis not present

## 2017-09-05 DIAGNOSIS — H409 Unspecified glaucoma: Secondary | ICD-10-CM | POA: Diagnosis not present

## 2017-09-05 DIAGNOSIS — K219 Gastro-esophageal reflux disease without esophagitis: Secondary | ICD-10-CM | POA: Diagnosis not present

## 2017-09-12 DIAGNOSIS — F329 Major depressive disorder, single episode, unspecified: Secondary | ICD-10-CM | POA: Diagnosis not present

## 2017-09-12 DIAGNOSIS — F419 Anxiety disorder, unspecified: Secondary | ICD-10-CM | POA: Diagnosis not present

## 2017-09-26 DIAGNOSIS — F329 Major depressive disorder, single episode, unspecified: Secondary | ICD-10-CM | POA: Diagnosis not present

## 2017-09-26 DIAGNOSIS — F419 Anxiety disorder, unspecified: Secondary | ICD-10-CM | POA: Diagnosis not present

## 2017-09-26 DIAGNOSIS — R112 Nausea with vomiting, unspecified: Secondary | ICD-10-CM | POA: Diagnosis not present

## 2017-09-28 DIAGNOSIS — D518 Other vitamin B12 deficiency anemias: Secondary | ICD-10-CM | POA: Diagnosis not present

## 2017-09-28 DIAGNOSIS — Z79899 Other long term (current) drug therapy: Secondary | ICD-10-CM | POA: Diagnosis not present

## 2017-09-28 DIAGNOSIS — E7849 Other hyperlipidemia: Secondary | ICD-10-CM | POA: Diagnosis not present

## 2017-09-28 DIAGNOSIS — E119 Type 2 diabetes mellitus without complications: Secondary | ICD-10-CM | POA: Diagnosis not present

## 2017-10-02 DIAGNOSIS — E038 Other specified hypothyroidism: Secondary | ICD-10-CM | POA: Diagnosis not present

## 2017-10-02 DIAGNOSIS — M150 Primary generalized (osteo)arthritis: Secondary | ICD-10-CM | POA: Diagnosis not present

## 2017-10-02 DIAGNOSIS — F331 Major depressive disorder, recurrent, moderate: Secondary | ICD-10-CM | POA: Diagnosis not present

## 2017-10-02 DIAGNOSIS — M818 Other osteoporosis without current pathological fracture: Secondary | ICD-10-CM | POA: Diagnosis not present

## 2017-10-03 DIAGNOSIS — F17210 Nicotine dependence, cigarettes, uncomplicated: Secondary | ICD-10-CM | POA: Diagnosis not present

## 2017-10-03 DIAGNOSIS — E039 Hypothyroidism, unspecified: Secondary | ICD-10-CM | POA: Diagnosis not present

## 2017-10-03 DIAGNOSIS — L408 Other psoriasis: Secondary | ICD-10-CM | POA: Diagnosis not present

## 2017-10-03 DIAGNOSIS — M810 Age-related osteoporosis without current pathological fracture: Secondary | ICD-10-CM | POA: Diagnosis not present

## 2017-10-10 DIAGNOSIS — F329 Major depressive disorder, single episode, unspecified: Secondary | ICD-10-CM | POA: Diagnosis not present

## 2017-10-10 DIAGNOSIS — F419 Anxiety disorder, unspecified: Secondary | ICD-10-CM | POA: Diagnosis not present

## 2017-10-30 DIAGNOSIS — F419 Anxiety disorder, unspecified: Secondary | ICD-10-CM | POA: Diagnosis not present

## 2017-10-30 DIAGNOSIS — F329 Major depressive disorder, single episode, unspecified: Secondary | ICD-10-CM | POA: Diagnosis not present

## 2017-11-02 DIAGNOSIS — F331 Major depressive disorder, recurrent, moderate: Secondary | ICD-10-CM | POA: Diagnosis not present

## 2017-11-02 DIAGNOSIS — M818 Other osteoporosis without current pathological fracture: Secondary | ICD-10-CM | POA: Diagnosis not present

## 2017-11-02 DIAGNOSIS — M150 Primary generalized (osteo)arthritis: Secondary | ICD-10-CM | POA: Diagnosis not present

## 2017-11-02 DIAGNOSIS — E038 Other specified hypothyroidism: Secondary | ICD-10-CM | POA: Diagnosis not present

## 2017-11-07 DIAGNOSIS — G40802 Other epilepsy, not intractable, without status epilepticus: Secondary | ICD-10-CM | POA: Diagnosis not present

## 2017-11-07 DIAGNOSIS — K219 Gastro-esophageal reflux disease without esophagitis: Secondary | ICD-10-CM | POA: Diagnosis not present

## 2017-11-07 DIAGNOSIS — N400 Enlarged prostate without lower urinary tract symptoms: Secondary | ICD-10-CM | POA: Diagnosis not present

## 2017-11-07 DIAGNOSIS — E785 Hyperlipidemia, unspecified: Secondary | ICD-10-CM | POA: Diagnosis not present

## 2017-11-12 DIAGNOSIS — R011 Cardiac murmur, unspecified: Secondary | ICD-10-CM | POA: Diagnosis not present

## 2017-11-13 DIAGNOSIS — F329 Major depressive disorder, single episode, unspecified: Secondary | ICD-10-CM | POA: Diagnosis not present

## 2017-11-13 DIAGNOSIS — F419 Anxiety disorder, unspecified: Secondary | ICD-10-CM | POA: Diagnosis not present

## 2017-11-17 DIAGNOSIS — I714 Abdominal aortic aneurysm, without rupture: Secondary | ICD-10-CM | POA: Diagnosis not present

## 2017-11-17 DIAGNOSIS — R0989 Other specified symptoms and signs involving the circulatory and respiratory systems: Secondary | ICD-10-CM | POA: Diagnosis not present

## 2017-11-20 DIAGNOSIS — R221 Localized swelling, mass and lump, neck: Secondary | ICD-10-CM | POA: Diagnosis not present

## 2017-11-20 DIAGNOSIS — I739 Peripheral vascular disease, unspecified: Secondary | ICD-10-CM | POA: Diagnosis not present

## 2017-11-27 DIAGNOSIS — F329 Major depressive disorder, single episode, unspecified: Secondary | ICD-10-CM | POA: Diagnosis not present

## 2017-11-27 DIAGNOSIS — F419 Anxiety disorder, unspecified: Secondary | ICD-10-CM | POA: Diagnosis not present

## 2017-11-29 DIAGNOSIS — E7849 Other hyperlipidemia: Secondary | ICD-10-CM | POA: Diagnosis not present

## 2017-11-29 DIAGNOSIS — Z79899 Other long term (current) drug therapy: Secondary | ICD-10-CM | POA: Diagnosis not present

## 2017-11-29 DIAGNOSIS — E559 Vitamin D deficiency, unspecified: Secondary | ICD-10-CM | POA: Diagnosis not present

## 2017-11-29 DIAGNOSIS — E119 Type 2 diabetes mellitus without complications: Secondary | ICD-10-CM | POA: Diagnosis not present

## 2017-11-29 DIAGNOSIS — E038 Other specified hypothyroidism: Secondary | ICD-10-CM | POA: Diagnosis not present

## 2017-11-29 DIAGNOSIS — D518 Other vitamin B12 deficiency anemias: Secondary | ICD-10-CM | POA: Diagnosis not present

## 2017-12-02 DIAGNOSIS — M150 Primary generalized (osteo)arthritis: Secondary | ICD-10-CM | POA: Diagnosis not present

## 2017-12-02 DIAGNOSIS — F331 Major depressive disorder, recurrent, moderate: Secondary | ICD-10-CM | POA: Diagnosis not present

## 2017-12-02 DIAGNOSIS — E038 Other specified hypothyroidism: Secondary | ICD-10-CM | POA: Diagnosis not present

## 2017-12-02 DIAGNOSIS — M818 Other osteoporosis without current pathological fracture: Secondary | ICD-10-CM | POA: Diagnosis not present

## 2017-12-06 DIAGNOSIS — N400 Enlarged prostate without lower urinary tract symptoms: Secondary | ICD-10-CM | POA: Diagnosis not present

## 2017-12-06 DIAGNOSIS — F17210 Nicotine dependence, cigarettes, uncomplicated: Secondary | ICD-10-CM | POA: Diagnosis not present

## 2017-12-06 DIAGNOSIS — E039 Hypothyroidism, unspecified: Secondary | ICD-10-CM | POA: Diagnosis not present

## 2017-12-06 DIAGNOSIS — G40802 Other epilepsy, not intractable, without status epilepticus: Secondary | ICD-10-CM | POA: Diagnosis not present

## 2017-12-08 DIAGNOSIS — Z23 Encounter for immunization: Secondary | ICD-10-CM | POA: Diagnosis not present

## 2017-12-19 DIAGNOSIS — N400 Enlarged prostate without lower urinary tract symptoms: Secondary | ICD-10-CM | POA: Diagnosis not present

## 2017-12-19 DIAGNOSIS — R351 Nocturia: Secondary | ICD-10-CM | POA: Diagnosis not present

## 2017-12-19 DIAGNOSIS — N318 Other neuromuscular dysfunction of bladder: Secondary | ICD-10-CM | POA: Diagnosis not present

## 2017-12-19 DIAGNOSIS — N3000 Acute cystitis without hematuria: Secondary | ICD-10-CM | POA: Diagnosis not present

## 2017-12-19 DIAGNOSIS — N401 Enlarged prostate with lower urinary tract symptoms: Secondary | ICD-10-CM | POA: Diagnosis not present

## 2017-12-20 DIAGNOSIS — L918 Other hypertrophic disorders of the skin: Secondary | ICD-10-CM | POA: Diagnosis not present

## 2017-12-20 DIAGNOSIS — N400 Enlarged prostate without lower urinary tract symptoms: Secondary | ICD-10-CM | POA: Diagnosis not present

## 2017-12-28 DIAGNOSIS — B353 Tinea pedis: Secondary | ICD-10-CM | POA: Diagnosis not present

## 2017-12-28 DIAGNOSIS — I739 Peripheral vascular disease, unspecified: Secondary | ICD-10-CM | POA: Diagnosis not present

## 2017-12-28 DIAGNOSIS — M79671 Pain in right foot: Secondary | ICD-10-CM | POA: Diagnosis not present

## 2018-01-02 DIAGNOSIS — G8929 Other chronic pain: Secondary | ICD-10-CM | POA: Diagnosis not present

## 2018-01-02 DIAGNOSIS — L820 Inflamed seborrheic keratosis: Secondary | ICD-10-CM | POA: Diagnosis not present

## 2018-01-02 DIAGNOSIS — L400 Psoriasis vulgaris: Secondary | ICD-10-CM | POA: Diagnosis not present

## 2018-01-02 DIAGNOSIS — B079 Viral wart, unspecified: Secondary | ICD-10-CM | POA: Diagnosis not present

## 2018-01-02 DIAGNOSIS — M150 Primary generalized (osteo)arthritis: Secondary | ICD-10-CM | POA: Diagnosis not present

## 2018-01-02 DIAGNOSIS — G40802 Other epilepsy, not intractable, without status epilepticus: Secondary | ICD-10-CM | POA: Diagnosis not present

## 2018-01-02 DIAGNOSIS — M549 Dorsalgia, unspecified: Secondary | ICD-10-CM | POA: Diagnosis not present

## 2018-01-02 DIAGNOSIS — L739 Follicular disorder, unspecified: Secondary | ICD-10-CM | POA: Diagnosis not present

## 2018-01-03 DIAGNOSIS — M545 Low back pain: Secondary | ICD-10-CM | POA: Diagnosis not present

## 2018-01-04 DIAGNOSIS — M150 Primary generalized (osteo)arthritis: Secondary | ICD-10-CM | POA: Diagnosis not present

## 2018-01-04 DIAGNOSIS — F331 Major depressive disorder, recurrent, moderate: Secondary | ICD-10-CM | POA: Diagnosis not present

## 2018-01-04 DIAGNOSIS — M818 Other osteoporosis without current pathological fracture: Secondary | ICD-10-CM | POA: Diagnosis not present

## 2018-01-04 DIAGNOSIS — E038 Other specified hypothyroidism: Secondary | ICD-10-CM | POA: Diagnosis not present

## 2018-01-16 ENCOUNTER — Encounter: Payer: Self-pay | Admitting: Adult Health

## 2018-01-16 ENCOUNTER — Ambulatory Visit (INDEPENDENT_AMBULATORY_CARE_PROVIDER_SITE_OTHER): Payer: Medicare Other | Admitting: Adult Health

## 2018-01-16 VITALS — BP 124/75 | HR 85 | Ht 66.0 in | Wt 209.4 lb

## 2018-01-16 DIAGNOSIS — G40909 Epilepsy, unspecified, not intractable, without status epilepticus: Secondary | ICD-10-CM

## 2018-01-16 NOTE — Progress Notes (Signed)
PATIENT: Jordan Thomas DOB: [birth year]  REASON FOR VISIT: follow up HISTORY FROM: patient  HISTORY OF PRESENT ILLNESS: Today 01/16/18:  Jordan Thomas is a 72 year old male with a history of seizures.  He returns today for follow-up.  He reports that he thinks he had a seizure about 2 to 3 months ago.  He reports that he woke up and he had blood on his pillow.  He does not remember biting his tongue though.  This was an unwitnessed event.  The patient denies missing any medication.  Denies being sick.  Denies sleep deprivation.  He denies any significant changes with his gait or balance.  Does report some back pain for which he plans to see an orthopedist.  He returns today for evaluation.  HISTORY Interval history from 33 November 2018.  I have the pleasure of meeting today with Jordan Thomas and his sister. The patient has done exceptionally well on Vimpat and Depakote, is independent in ADLs, his last seizure spell was in December one year ago.  He did have Some complaint of vertigo over the last year which resolved with vestibular rehab.  He continues to take his medication regularly, residing at [facility].  He gets assistance with his oral medication.   REVIEW OF SYSTEMS: Out of a complete 14 system review of symptoms, the patient complains only of the following symptoms, and all other reviewed systems are negative.  Seizure, speech difficulty, excessive eating, constipation, diarrhea, rectal pain, acting out dreams, eye itching  ALLERGIES: Allergies  Allergen Reactions  . Aricept [Donepezil Hcl]   . Hydrocodone   . Ibuprofen   . Mesantoin [Mephenytoin]   . Tegretol [Carbamazepine]   . Topamax [Topiramate]   . Wellbutrin [Bupropion]   . Zonegran [Zonisamide]     HOME MEDICATIONS: Outpatient Medications Prior to Visit  Medication Sig Dispense Refill  . alendronate (FOSAMAX) 70 MG tablet Take 70 mg by mouth once a week.     . Artificial Tear Ointment (ARTIFICIAL TEARS)  ointment Place into both eyes 3 (three) times daily.    Marland Kitchen aspirin 81 MG tablet Take 81 mg by mouth daily.    . bimatoprost (LUMIGAN) 0.01 % SOLN Place 1 drop into both eyes at bedtime.    . Calcium Carb-Cholecalciferol (OYSTER SHELL CALCIUM + D PO) Take 1 tablet by mouth daily.    . clotrimazole (LOTRIMIN) 1 % cream Apply 1 application topically 2 (two) times daily. Apply to groin for fungal infection    . divalproex (DEPAKOTE) 250 MG DR tablet Take 2 tablets (500 mg total) by mouth 2 (two) times daily. 360 tablet 3  . finasteride (PROSCAR) 5 MG tablet Take 5 mg by mouth daily.     Marland Kitchen lacosamide (VIMPAT) 200 MG TABS tablet Take 1 tablet (200 mg total) by mouth 2 (two) times daily. 180 tablet 3  . levothyroxine (SYNTHROID, LEVOTHROID) 100 MCG tablet 100 mcg daily before breakfast.     . linaclotide (LINZESS) 290 MCG CAPS capsule Take 290 mcg by mouth. Every 48 hours.    Marland Kitchen loperamide (IMODIUM) 2 MG capsule as needed.     . loratadine (CLARITIN) 10 MG tablet Take 10 mg by mouth 2 (two) times daily.    Marland Kitchen LORazepam (ATIVAN) 1 MG tablet Take 1 mg by mouth 3 (three) times daily as needed for seizure.    . nystatin cream (MYCOSTATIN)     . omeprazole (PRILOSEC) 20 MG capsule     . ondansetron (ZOFRAN) 4 MG tablet  Take 4 mg by mouth every 6 (six) hours as needed for nausea or vomiting.    . tadalafil (CIALIS) 5 MG tablet Take 5 mg by mouth daily as needed for erectile dysfunction.    . tamsulosin (FLOMAX) 0.4 MG CAPS capsule Take 0.4 mg by mouth daily.    Marland Kitchen UNABLE TO FIND Med Name: Therapy M 9mg  -456mcg po daily    . atorvastatin (LIPITOR) 10 MG tablet Take 10 mg by mouth. Every 48 hours     No facility-administered medications prior to visit.     PAST MEDICAL HISTORY: Past Medical History:  Diagnosis Date  . Aplastic anemia ([place])   . Epilepsy ([place])   . 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 Memory loss 06/04/2012  . Right hip pain 06/04/2012  . Tremor     PAST  SURGICAL HISTORY: Past Surgical History:  Procedure Laterality Date  . CATARACT EXTRACTION    . CRANIOTOMY Right    frontal    FAMILY HISTORY: Family History  Problem Relation Age of Onset  . Parkinson's disease Mother        versus lewy body dementia  . Diabetes Other   . Seizures Other        grand mal  . Stroke Maternal Grandmother   . Heart disease Maternal Grandmother   . Stroke Maternal Grandfather   . Heart disease Maternal Grandfather   . Stroke Paternal Grandmother   . Heart disease Paternal Grandmother   . Stroke Paternal Grandfather   . Heart disease Paternal Grandfather   . Mental retardation Cousin     SOCIAL HISTORY: Social History   Socioeconomic History  . Marital status: Single    Spouse name: Not on file  . Number of children: 0  . Years of education: college  . Highest education level: Not on file  Occupational History  . Occupation: Doctor, general practice    Comment: resides in [place]  . Financial resource strain: Not on file  . Food insecurity:    Worry: Not on file    Inability: Not on file  . Transportation needs:    Medical: Not on file    Non-medical: Not on file  Tobacco Use  . Smoking status: Current Every Day Smoker    Packs/day: 0.50    Years: 50.00    Pack years: 25.00    Types: Cigarettes  . Smokeless tobacco: Former Systems developer    Quit date: 06/19/2012  Substance and Sexual Activity  . Alcohol use: No    Alcohol/week: 0.0 standard drinks  . Drug use: No  . Sexual activity: Not on file  Lifestyle  . Physical activity:    Days per week: Not on file    Minutes per session: Not on file  . Stress: Not on file  Relationships  . Social connections:    Talks on phone: Not on file    Gets together: Not on file    Attends religious service: Not on file    Active member of club or organization: Not on file    Attends meetings of clubs or organizations: Not on file    Relationship status: Not on file  . Intimate partner  violence:    Fear of current or ex partner: Not on file    Emotionally abused: Not on file    Physically abused: Not on file    Forced sexual activity: Not on file  Other Topics Concern  . Not on file  Social History Narrative   Patient is single and lives at [facility] [place] [facility].    Patient does not have any children.   Patient is retired.   Patient has some college education.   Patient is right-handed.   Patient drinks four cups of coffee daily, one cup of soda daily.      PHYSICAL EXAM  Vitals:   01/16/18 1241  Weight: 209 lb 6.4 oz (95 kg)  Height: 5\' 6"  (1.676 m)   Body mass index is 33.8 kg/m.  Generalized: Well developed, in no acute distress   Neurological examination  Mentation: Alert oriented to time, place, history taking. Follows all commands speech and language fluent Cranial nerve II-XII: Pupils were equal round reactive to light. Extraocular movements were full, visual field were full on confrontational test. Facial sensation and strength were normal. Uvula tongue midline. Head turning and shoulder shrug  were normal and symmetric. Motor: The motor testing reveals 5 over 5 strength of all 4 extremities. Good symmetric motor tone is noted throughout.  Sensory: Sensory testing is intact to soft touch on all 4 extremities. No evidence of extinction is noted.  Coordination: Cerebellar testing reveals good finger-nose-finger and heel-to-shin bilaterally.  Gait and station: Gait is normal.  Reflexes: Deep tendon reflexes are symmetric and normal bilaterally.   DIAGNOSTIC DATA (LABS, IMAGING, TESTING) - I reviewed patient records, labs, notes, testing and imaging myself where available.  Lab Results  Component Value Date   WBC 7.8 01/13/2017   HGB 12.7 (L) 01/13/2017   HCT 38.0 01/13/2017   MCV 94 01/13/2017   PLT 207 01/13/2017      Component Value Date/Time   NA 142 01/13/2017 1053   K 4.9 01/13/2017 1053   CL 102 01/13/2017 1053   CO2 26  01/13/2017 1053   GLUCOSE 85 01/13/2017 1053   BUN 24 01/13/2017 1053   CREATININE 0.71 (L) 01/13/2017 1053   CALCIUM 9.5 01/13/2017 1053   PROT 7.0 01/13/2017 1053   ALBUMIN 3.9 01/13/2017 1053   AST 15 01/13/2017 1053   ALT 26 01/13/2017 1053   ALKPHOS 64 01/13/2017 1053   BILITOT 0.2 01/13/2017 1053   GFRNONAA 96 01/13/2017 1053   GFRAA 111 01/13/2017 1053      ASSESSMENT AND PLAN 72 y.o. 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 here with:  1.  Seizures  Overall the patient has remained stable.  It is questionable whether he had a seizure event  3 months ago.  He will continue on Depakote and Vimpat.  I will check blood work today.  He is advised that if his symptoms worsen or he develops new symptoms he should let us know.  He will follow-up in 6 months or sooner if needed.   I spent 15 minutes with the patient. 50% of this time was spent reviewing his plan of care   Ward Givens, MSN, NP-C 01/16/2018, 12:59 PM [facility] [address], [place], [place] [address] [phone]

## 2018-01-16 NOTE — Patient Instructions (Signed)
Your Plan:  Continue Vimpat and Depakote  Blood work today If you have any seizure events please let us know.      Thank you for coming to see [place] at [facility]. I hope we have been able to provide you high quality care today.  You may receive a patient satisfaction survey over the next few weeks. We would appreciate your feedback and comments so that we may continue to improve ourselves and the health of our patients.

## 2018-01-18 LAB — CBC WITH DIFFERENTIAL/PLATELET
Basophils Absolute: 0.1 10*3/uL (ref 0.0–0.2)
Basos: 1 %
EOS (ABSOLUTE): 0 10*3/uL (ref 0.0–0.4)
Eos: 0 %
HEMATOCRIT: 37.5 % (ref 37.5–51.0)
HEMOGLOBIN: 12.4 g/dL — AB (ref 13.0–17.7)
Immature Grans (Abs): 0.1 10*3/uL (ref 0.0–0.1)
Immature Granulocytes: 1 %
LYMPHS ABS: 2.2 10*3/uL (ref 0.7–3.1)
Lymphs: 34 %
MCH: 30 pg (ref 26.6–33.0)
MCHC: 33.1 g/dL (ref 31.5–35.7)
MCV: 91 fL (ref 79–97)
Monocytes Absolute: 0.6 10*3/uL (ref 0.1–0.9)
Monocytes: 10 %
Neutrophils Absolute: 3.6 10*3/uL (ref 1.4–7.0)
Neutrophils: 54 %
Platelets: 184 10*3/uL (ref 150–450)
RBC: 4.13 x10E6/uL — ABNORMAL LOW (ref 4.14–5.80)
RDW: 14.6 % (ref 12.3–15.4)
WBC: 6.6 10*3/uL (ref 3.4–10.8)

## 2018-01-18 LAB — COMPREHENSIVE METABOLIC PANEL
ALBUMIN: 4.1 g/dL (ref 3.5–4.8)
ALK PHOS: 66 IU/L (ref 39–117)
ALT: 25 IU/L (ref 0–44)
AST: 18 IU/L (ref 0–40)
Albumin/Globulin Ratio: 1.6 (ref 1.2–2.2)
BILIRUBIN TOTAL: 0.2 mg/dL (ref 0.0–1.2)
BUN / CREAT RATIO: 20 (ref 10–24)
BUN: 14 mg/dL (ref 8–27)
CO2: 24 mmol/L (ref 20–29)
Calcium: 9.5 mg/dL (ref 8.6–10.2)
Chloride: 101 mmol/L (ref 96–106)
Creatinine, Ser: 0.7 mg/dL — ABNORMAL LOW (ref 0.76–1.27)
GFR calc Af Amer: 111 mL/min/{1.73_m2} (ref >59)
GFR calc non Af Amer: 96 mL/min/{1.73_m2} (ref >59)
Globulin, Total: 2.6 g/dL (ref 1.5–4.5)
Glucose: 72 mg/dL (ref 65–99)
Potassium: 5 mmol/L (ref 3.5–5.2)
Sodium: 141 mmol/L (ref 134–144)
Total Protein: 6.7 g/dL (ref 6.0–8.5)

## 2018-01-18 LAB — VALPROIC ACID LEVEL: Valproic Acid Lvl: 83 ug/mL (ref 50–100)

## 2018-01-18 LAB — LACOSAMIDE: Lacosamide: 23.2 ug/mL — ABNORMAL HIGH (ref 5.0–10.0)

## 2018-01-22 ENCOUNTER — Telehealth: Payer: Self-pay | Admitting: *Deleted

## 2018-01-22 NOTE — Telephone Encounter (Signed)
Called sister, POA and advised her the patient's labs are unremarkable, consistent with previous labs. Informed her his Lacosamide level is elevated, but there's no concern over toxicity. She verbalized understanding, appreciation.

## 2018-01-23 DIAGNOSIS — N401 Enlarged prostate with lower urinary tract symptoms: Secondary | ICD-10-CM | POA: Diagnosis not present

## 2018-01-23 DIAGNOSIS — N318 Other neuromuscular dysfunction of bladder: Secondary | ICD-10-CM | POA: Diagnosis not present

## 2018-01-24 DIAGNOSIS — M549 Dorsalgia, unspecified: Secondary | ICD-10-CM | POA: Diagnosis not present

## 2018-01-24 DIAGNOSIS — G8929 Other chronic pain: Secondary | ICD-10-CM | POA: Diagnosis not present

## 2018-01-30 DIAGNOSIS — H409 Unspecified glaucoma: Secondary | ICD-10-CM | POA: Diagnosis not present

## 2018-01-30 DIAGNOSIS — N400 Enlarged prostate without lower urinary tract symptoms: Secondary | ICD-10-CM | POA: Diagnosis not present

## 2018-01-30 DIAGNOSIS — K5909 Other constipation: Secondary | ICD-10-CM | POA: Diagnosis not present

## 2018-01-30 DIAGNOSIS — E039 Hypothyroidism, unspecified: Secondary | ICD-10-CM | POA: Diagnosis not present

## 2018-02-03 DIAGNOSIS — M150 Primary generalized (osteo)arthritis: Secondary | ICD-10-CM | POA: Diagnosis not present

## 2018-02-03 DIAGNOSIS — E038 Other specified hypothyroidism: Secondary | ICD-10-CM | POA: Diagnosis not present

## 2018-02-03 DIAGNOSIS — F331 Major depressive disorder, recurrent, moderate: Secondary | ICD-10-CM | POA: Diagnosis not present

## 2018-02-03 DIAGNOSIS — M818 Other osteoporosis without current pathological fracture: Secondary | ICD-10-CM | POA: Diagnosis not present

## 2019-01-21 ENCOUNTER — Other Ambulatory Visit: Payer: Self-pay

## 2019-01-21 ENCOUNTER — Ambulatory Visit (INDEPENDENT_AMBULATORY_CARE_PROVIDER_SITE_OTHER): Payer: Medicare Other | Admitting: Neurology

## 2019-01-21 ENCOUNTER — Encounter: Payer: Self-pay | Admitting: Neurology

## 2019-01-21 DIAGNOSIS — G40209 Localization-related (focal) (partial) symptomatic epilepsy and epileptic syndromes with complex partial seizures, not intractable, without status epilepticus: Secondary | ICD-10-CM | POA: Diagnosis not present

## 2019-01-21 DIAGNOSIS — R413 Other amnesia: Secondary | ICD-10-CM | POA: Diagnosis not present

## 2019-01-21 DIAGNOSIS — Z79899 Other long term (current) drug therapy: Secondary | ICD-10-CM | POA: Diagnosis not present

## 2019-01-21 DIAGNOSIS — M25551 Pain in right hip: Secondary | ICD-10-CM | POA: Diagnosis not present

## 2019-01-21 MED ORDER — DIVALPROEX SODIUM 250 MG PO DR TAB: 500.0000 mg | DELAYED_RELEASE_TABLET | Freq: Two times a day (BID) | ORAL | 3 refills | Status: DC

## 2019-01-21 MED ORDER — LACOSAMIDE 200 MG PO TABS: 200.0000 mg | ORAL_TABLET | Freq: Two times a day (BID) | ORAL | 3 refills | Status: DC

## 2019-01-21 NOTE — Progress Notes (Signed)
Virtual Visit via Video Note  I connected with Jordan Thomas on 01/21/19 at  1:30 PM EST by a video enabled telemedicine application and verified that I am speaking with the correct person using two identifiers.  Location: Patient: at care facility resident at [facility].  Provider: at the [facility] office.    I discussed the limitations of evaluation and management by telemedicine and the availability of in person appointments. The patient expressed understanding and agreed to proceed.  History of Present Illness:   [name] is a 73 year old resident at an extended care facility here in [place], [place].  With a longstanding history of a seizure disorder which has been well controlled on Vimpat and Depakote, he was independent in most activities of daily living but he did develop memory loss and does not drive.  He had some orthopedic procedures within the last 12 months but his facility went into Covid related lockdown and he has not had physical therapy since March.    01-21-2019 :He reports that he suffered a seizure about a month ago either October or November while he was alone in his room.  He also states he suffered a tongue bite but no incontinence.  He did not alert any members of the staff.  He denies skipping any medications and his caretakers are delivering the medication on time and usually watched watch the patient taking it.  Prior to that seizure that he subjectively reported the last seizure was in October 2019.   Curious about the coincidence that the seizures happened in October I asked if he had suffered any upper respiratory infection, inflammation, febrile illness, any medication changes, sleep deprivation or if he was taking decongestants.  He denied all of these possibilities and his caretaker also did.    Observations/Objective: Patient for severe breakthrough seizures many of the not witnessed and therefore it is not clear how long he may lose awareness, and  if he suffered a seizure at all.  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  This condition is treated with topical creams.  I confirm that he is currently on alendronate-Fosamax, artificial tears, Lumigan eyedrops, calcium, Lotrimin cream, Depakote 250 mg 2 in the morning 2 at night, Proscar 5 mg daily Vimpat 200 mg by mouth 2 times daily and he is taking levothyroxine 100 mcg daily before breakfast, Linzess, Imodium as needed, Claritin as needed, Ativan 1 mg as needed for seizures.  Nystatin, omeprazole, Zofran as needed, Cialis as needed, tamsulosin as needed, Lipitor 10 mg every second day.  He  The patient has a history of aplastic anemia, epilepsy with secondary generalization, without status epilepticus.  Progressive memory loss, right hip pain, essential tremor medication related.  He denies losing taste or smell.  He was seated throughout the whole observation on video.  Last labs in my records were from 2019.   Assessment and Plan: refill of all epilepsy medications.  Order of blood draw through his residential facility.     Follow Up Instructions: medication management related orders were discussed.   Rv in 6 month face to face preferred.   I will have an order for Comprehensive metabolic panel and CBC with diff faxed to his facility. An outside service performs blood draws there for the residents. Order fax to : [phone].     I discussed the assessment and treatment plan with the patient. The patient was provided  an opportunity to ask questions and all were answered. The patient agreed with the plan and demonstrated an understanding of the instructions.   The patient was advised to call back or seek an in-person evaluation if the symptoms worsen or if the condition fails to improve as anticipated.  I provided 10 minutes of non-face-to-face time during this encounter.   Larey Seat, MD

## 2019-01-21 NOTE — Patient Instructions (Signed)
I refilled Vimpat and Depakote for you, and also ordered blood testing to make sure your liver and kidney function is all right.  I hope to see you in 6 month face -to-face.   Larey Seat, MD

## 2019-10-09 ENCOUNTER — Encounter: Payer: Self-pay | Admitting: Neurology

## 2019-10-09 ENCOUNTER — Ambulatory Visit (INDEPENDENT_AMBULATORY_CARE_PROVIDER_SITE_OTHER): Payer: Medicare Other | Admitting: Neurology

## 2019-10-09 VITALS — BP 118/68 | HR 99 | Ht 67.0 in | Wt 221.0 lb

## 2019-10-09 DIAGNOSIS — G40209 Localization-related (focal) (partial) symptomatic epilepsy and epileptic syndromes with complex partial seizures, not intractable, without status epilepticus: Secondary | ICD-10-CM | POA: Diagnosis not present

## 2019-10-09 MED ORDER — DIVALPROEX SODIUM ER 500 MG PO TB24: 1000.0000 mg | ORAL_TABLET | Freq: Every day | ORAL | 5 refills | Status: DC

## 2019-10-09 NOTE — Patient Instructions (Signed)
Valproic Acid, Divalproex Sodium delayed or extended-release tablets What is this medicine? DIVALPROEX SODIUM (dye VAL pro ex SO dee um) is used to prevent seizures caused by some forms of epilepsy. It is also used to treat bipolar mania and to prevent migraine headaches. This medicine may be used for other purposes; ask your health care provider or pharmacist if you have questions. COMMON BRAND NAME(S): Depakote, Depakote ER What should I tell my health care provider before I take this medicine? They need to know if you have any of these conditions:  if you often drink alcohol  kidney disease  liver disease  low platelet counts  mitochondrial disease  suicidal thoughts, plans, or attempt; a previous suicide attempt by you or a family member  urea cycle disorder (UCD)  an unusual or allergic reaction to divalproex sodium, sodium valproate, valproic acid, other medicines, foods, dyes, or preservatives  pregnant or trying to get pregnant  breast-feeding How should I use this medicine? Take this medicine by mouth with a drink of water. Follow the directions on the prescription label. Do not cut, crush or chew this medicine. You can take it with or without food. If it upsets your stomach, take it with food. Take your medicine at regular intervals. Do not take it more often than directed. Do not stop taking except on your doctor's advice. A special MedGuide will be given to you by the pharmacist with each prescription and refill. Be sure to read this information carefully each time. Talk to your pediatrician regarding the use of this medicine in children. While this drug may be prescribed for children as young as 10 years for selected conditions, precautions do apply. Overdosage: If you think you have taken too much of this medicine contact a poison control center or emergency room at once. NOTE: This medicine is only for you. Do not share this medicine with others. What if I miss a  dose? If you miss a dose, take it as soon as you can. If it is almost time for your next dose, take only that dose. Do not take double or extra doses. What may interact with this medicine? Do not take this medicine with any of the following medications:  sodium phenylbutyrate This medicine may also interact with the following medications:  aspirin  certain antibiotics like ertapenem, imipenem, meropenem  certain medicines for depression, anxiety, or psychotic disturbances  certain medicines for seizures like carbamazepine, clonazepam, diazepam, ethosuximide, felbamate, lamotrigine, phenobarbital, phenytoin, primidone, rufinamide, topiramate  certain medicines that treat or prevent blood clots like warfarin  cholestyramine  male hormones, like estrogens and birth control pills, patches, or rings  propofol  rifampin  ritonavir  tolbutamide  zidovudine This list may not describe all possible interactions. Give your health care provider a list of all the medicines, herbs, non-prescription drugs, or dietary supplements you use. Also tell them if you smoke, drink alcohol, or use illegal drugs. Some items may interact with your medicine. What should I watch for while using this medicine? Tell your doctor or health care provider if your symptoms do not get better or they start to get worse. This medicine may cause serious skin reactions. They can happen weeks to months after starting the medicine. Contact your health care provider right away if you notice fevers or flu-like symptoms with a rash. The rash may be red or purple and then turn into blisters or peeling of the skin. Or, you might notice a red rash with swelling of the   face, lips or lymph nodes in your neck or under your arms. Wear a medical ID bracelet or chain, and carry a card that describes your disease and details of your medicine and dosage times. You may get drowsy, dizzy, or have blurred vision. Do not drive, use  machinery, or do anything that needs mental alertness until you know how this medicine affects you. To reduce dizzy or fainting spells, do not sit or stand up quickly, especially if you are an older patient. Alcohol can increase drowsiness and dizziness. Avoid alcoholic drinks. This medicine can make you more sensitive to the sun. Keep out of the sun. If you cannot avoid being in the sun, wear protective clothing and use sunscreen. Do not use sun lamps or tanning beds/booths. Patients and their families should watch out for new or worsening depression or thoughts of suicide. Also watch out for sudden changes in feelings such as feeling anxious, agitated, panicky, irritable, hostile, aggressive, impulsive, severely restless, overly excited and hyperactive, or not being able to sleep. If this happens, especially at the beginning of treatment or after a change in dose, call your health care provider. Women should inform their doctor if they wish to become pregnant or think they might be pregnant. There is a potential for serious side effects to an unborn child. Talk to your health care provider or pharmacist for more information. Women who become pregnant while using this medicine may enroll in the [place] Antiepileptic Drug Pregnancy Registry by calling 1-888-233-2334. This registry collects information about the safety of antiepileptic drug use during pregnancy. This medicine may cause a decrease in folic acid and vitamin D. You should make sure that you get enough vitamins while you are taking this medicine. Discuss the foods you eat and the vitamins you take with your health care provider. What side effects may I notice from receiving this medicine? Side effects that you should report to your doctor or health care professional as soon as possible:  allergic reactions like skin rash, itching or hives, swelling of the face, lips, or tongue  changes in vision  rash, fever, and swollen lymph  nodes  redness, blistering, peeling or loosening of the skin, including inside the mouth  signs and symptoms of liver injury like dark yellow or brown urine; general ill feeling or flu-like symptoms; light-colored stools; loss of appetite; nausea; right upper belly pain; unusually weak or tired; yellowing of the eyes or skin  suicidal thoughts or other mood changes  unusual bleeding or bruising Side effects that usually do not require medical attention (report to your doctor or health care professional if they continue or are bothersome):  constipation  diarrhea  dizziness  hair loss  headache  loss of appetite  weight gain This list may not describe all possible side effects. Call your doctor for medical advice about side effects. You may report side effects to FDA at 1-800-FDA-1088. Where should I keep my medicine? Keep out of reach of children. Store at room temperature between 15 and 30 degrees C (59 and 86 degrees F). Keep container tightly closed. Throw away any unused medicine after the expiration date. NOTE: This sheet is a summary. It may not cover all possible information. If you have questions about this medicine, talk to your doctor, pharmacist, or health care provider.  2020 Elsevier/Gold Standard (2018-05-11 16:03:42)  

## 2019-10-09 NOTE — Progress Notes (Signed)
PATIENT: Jordan Thomas DOB: 14-Jul-1945  REASON FOR VISIT: follow up HISTORY FROM: patient  HISTORY OF PRESENT ILLNESS: 10-09-2019;  I have the pleasure of meeting Ms. Jordan Thomas today who is a 74 year old Caucasian gentleman living in an extended care facility here in [place].  He has a longstanding history of a seizure disorder and is usually accompanied by a family member.  Overall he had no new seizure-like activity out of the ordinary, but he was just 2 month ago diagnosed with diabetes. He remains on Vimpat 200 mg tablets, he is on valproic acid 250 mg 2 tablets by mouth 2 times a day for seizures.  Metformin, omeprazole, multivitamin, levothyroxine sodium 100 mcg, alendronate, aspirin 81 mg chewable a day, his glucose was monitored twice daily at the facility, he uses melatonin to sleep, he has Lumigan eyedrops, Linzess capsules for irritable bowel, desmopressin for nocturia, finasteride for prostate hyperplasia,  Claritin as needed for sinus and allergies, atorvastatin for hypercholesterolemia and he can use Zofran for nausea and Tylenol for pain or fever.  So at this time I do not see any medication aside of valproate that would make him gain weight or increase his blood sugar level.  I like for him to stay on depakote , but change it to ER  1000 mg at dinner time-       Video: [id number] CD-[name] is a 74 year old resident at an extended care facility here in [place], [place].  With a longstanding history of a seizure disorder which has been well controlled on Vimpat and Depakote, he was independent in most activities of daily living but he did develop memory loss and does not drive.  He had some orthopedic procedures within the last 12 months but his facility went into Covid related lockdown and he has not had physical therapy since March.    01-21-2019 :He reports that he suffered a seizure about a month ago either October or November while  he was alone in his room.  He also states he suffered a tongue bite but no incontinence.  He did not alert any members of the staff.  He denies skipping any medications and his caretakers are delivering the medication on time and usually watched watch the patient taking it.  Prior to that seizure that he subjectively reported the last seizure was in October 2019.   Curious about the coincidence that the seizures happened in October I asked if he had suffered any upper respiratory infection, inflammation, febrile illness, any medication changes, sleep deprivation or if he was taking decongestants.  He denied all of these possibilities and his caretaker also did.    Observations/Objective: Patient for severe breakthrough seizures many of the not witnessed and therefore it is not clear how long he may lose awareness, and if he suffered a seizure at all.  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  This condition is treated with topical creams.  I confirm that he is currently on alendronate-Fosamax, artificial tears, Lumigan eyedrops, calcium, Lotrimin cream, Depakote 250 mg 2 in the morning 2 at night, Proscar 5 mg daily Vimpat 200 mg by mouth 2 times daily and he is taking levothyroxine 100 mcg daily before breakfast, Linzess, Imodium as needed, Claritin as needed, Ativan 1 mg as needed for seizures.  Nystatin, omeprazole, Zofran as needed, Cialis as needed, tamsulosin as needed, Lipitor 10 mg every second day.  He  The patient has a history of aplastic anemia, epilepsy with secondary generalization, without status epilepticus.  Progressive memory loss, right hip pain, essential tremor medication related.  He denies losing taste or smell.  He was seated throughout the whole observation on video.  Last labs in my records were from 2019.    Interval history from 48 November 2018.  I have the pleasure of meeting today with [name] and his  sister. The patient has done exceptionally well on Vimpat and Depakote, is independent in ADLs, his last seizure spell was in December one year ago.  He did have Some complaint of vertigo over the last year which resolved with vestibular rehab.  He continues to take his medication regularly, residing [facility].  He gets assistance with his oral medication.    [name] is a 74 year old male with a history of seizures. He returns today for follow-up. He continues on Depakote 250 mg 1 tablet in the morning and 2 tablets at bedtime. He also is on Vimpat. He reports that he has not had any recent seizures. He reports that he had a seizure approximately one month after his last visit with Dr. Brett Fairy. However he does not remember if there was anything that triggered the event. He states that he never misses his medication. He continues to live at [facility]. He is able to complete all ADLs independently. He does not operate a motor vehicle. He reports episodes of dizziness that occurs approximately 3 times a day. It is not associated with positional changes. He states that the dizziness makes him feel as if he will have a seizure but the seizure never comes. He states that this is the same sensation he has gotten in the past prior to a seizure event. His primary care did a carotid Doppler that showed no hemodynamically significant ICA stenosis. He is also been referred to ENT for an evaluation. He returns today for an evaluation.   HISTORY 06/17/15: [name] is a 74 year old male with seizures. He returns today for follow-up. He continues to take Depakote and Vimpat. He is tolerating these medications well. He states that he has not had a seizure since Halloween. He continues to live at [facility]. He is able to complete all ADLs independently. He does not operate a motor vehicle. Denies any changes with her gait or balance. He denies any new neurological symptoms. He returns today for an  evaluation.  HISTORY (Jordan Thomas): [name] is a 74 year old caucasian male with a history of seizures.  He returns today for follow-up. He is currently taking Depakote and Vimpat. Patient states that he had two seizures in December. He states that he has grand mal seizures. He denies missing any medication. He does state that his mom passed in December and he is not sure if the stress from that contributed to his seizures. He also has a roommate that keeps him up at night. He states that his roommate gets up and walks around during the night. He feels that he does not sleep well due to this. He goes to bed around midnight and wakes at 6:00 AM.  He currently lives at [facility] and reports that he has requested a new room mate but that so far as not happened.  Since [name] lives with a roommate at his facility he is dependent on the sleep cycles of others to yesterday for example he could only go to bed at about 4 AM and then slept until  7. He is here today for routine visit and refills. He had no seizures since the last 6 years. I have recommended to discontinue or wean off Wellbutrin as it lowered the seizure threshold. He had also used Chantix for smoking cessation. Family had 2 seizures in December , and his dacility still hesitated discontinuing that medication. His gait has not further changed. He feels stable. He is treated for epilepsy he is treated for glaucoma with Lumigan eyedrops, he has a history of falls his last fall was 18 months ago after his hip replacement. He is a much more stable gait since then. Patient has had 2 seizures in December. I had increased Vimpat to 200 mg BID. I will check blood work. This will be completed at his facility since he has already taken his medication this morning. I have requested that his facility offer him a new room/ roommate so the patient can get better sleep.   REVIEW OF SYSTEMS: Out of a complete 14 system review of symptoms, the patient  complains only of the following symptoms, and all other reviewed systems are negative.  Vertigo - Back pain, seizures, bruise/bleed easily  ALLERGIES: Allergies  Allergen Reactions  . Aricept [Donepezil Hcl]   . Hydrocodone   . Ibuprofen   . Mesantoin [Mephenytoin]   . Tegretol [Carbamazepine]   . Topamax [Topiramate]   . Wellbutrin [Bupropion]   . Zonegran [Zonisamide]     HOME MEDICATIONS: Outpatient Medications Prior to Visit  Medication Sig Dispense Refill  . acetaminophen (TYLENOL) 325 MG tablet Take 650 mg by mouth every 6 (six) hours as needed.    Marland Kitchen alendronate (FOSAMAX) 70 MG tablet Take 70 mg by mouth once a week.     . AMOXICILLIN PO Take 1 tablet by mouth 3 times/day as needed-between meals & bedtime (Take 1 hour prior to dental appointment).    . Artificial Tear Ointment (ARTIFICIAL TEARS) ointment Place into both eyes 3 (three) times daily.    Marland Kitchen aspirin 81 MG tablet Take 81 mg by mouth daily.    Marland Kitchen atorvastatin (LIPITOR) 40 MG tablet Take 40 mg by mouth daily.    . bimatoprost (LUMIGAN) 0.01 % SOLN Place 1 drop into both eyes at bedtime.    . calcipotriene (DOVONOX) 0.005 % cream Apply 1 application topically 2 (two) times daily.    . Calcium Carb-Cholecalciferol (OYSTER SHELL CALCIUM + D PO) Take 1 tablet by mouth daily.    . clotrimazole (LOTRIMIN) 1 % cream Apply 1 application topically 2 (two) times daily. Apply to groin for fungal infection    . desmopressin (DDAVP) 0.2 MG tablet Take 0.6 mg by mouth daily.    . divalproex (DEPAKOTE) 250 MG DR tablet Take 2 tablets (500 mg total) by mouth 2 (two) times daily. 360 tablet 3  . Emollient (MINERIN) LOTN Apply 1 application topically 2 (two) times daily.    Marland Kitchen EPINEPHrine 0.3 mg/0.3 mL IJ SOAJ injection Inject 0.3 mg into the muscle as needed.    . finasteride (PROSCAR) 5 MG tablet Take 5 mg by mouth daily.     . halobetasol (ULTRAVATE) 0.05 % cream Apply 1 application topically 2 (two) times daily.    Marland Kitchen ketoconazole  (NIZORAL) 2 % shampoo Apply 1 application topically 2 (two) times a week. Monday and Friday    . lacosamide (VIMPAT) 200 MG TABS tablet Take 1 tablet (200 mg total) by mouth 2 (two) times daily. 180 tablet 3  . levothyroxine (SYNTHROID, LEVOTHROID) 100 MCG  tablet 100 mcg daily before breakfast.     . linaclotide (LINZESS) 290 MCG CAPS capsule Take 290 mcg by mouth. Every 48 hours.    Marland Kitchen loperamide (IMODIUM) 2 MG capsule Take 2 mg by mouth as needed.     . loratadine (CLARITIN) 10 MG tablet Take 10 mg by mouth 2 (two) times daily.    . melatonin 3 MG TABS tablet Take 3 mg by mouth at bedtime.    . metFORMIN (GLUCOPHAGE) 500 MG tablet Take 500 mg by mouth 2 (two) times daily.    . Multiple Vitamin (MULTIVITAMIN) tablet Take 1 tablet by mouth daily.    Marland Kitchen nystatin cream (MYCOSTATIN) Apply 1 application topically daily.     Marland Kitchen omeprazole (PRILOSEC) 20 MG capsule     . ondansetron (ZOFRAN) 4 MG tablet Take 4 mg by mouth every 6 (six) hours as needed for nausea or vomiting.    . sodium fluoride (PREVIDENT 5000 PLUS) 1.1 % CREA dental cream Place 1 application onto teeth every evening.    . tamsulosin (FLOMAX) 0.4 MG CAPS capsule Take 0.4 mg by mouth daily.    Marland Kitchen triamcinolone cream (KENALOG) 0.1 % Apply 1 application topically daily.    . fesoterodine (TOVIAZ) 8 MG TB24 tablet Take 8 mg by mouth daily. (Patient not taking: Reported on 10/09/2019)    . tadalafil (CIALIS) 5 MG tablet Take 5 mg by mouth daily as needed for erectile dysfunction. (Patient not taking: Reported on 10/09/2019)    . HYDROcodone-acetaminophen (NORCO/VICODIN) 5-325 MG tablet Take 1 tablet by mouth every 6 (six) hours as needed for moderate pain.    Marland Kitchen LORazepam (ATIVAN) 1 MG tablet Take 1 mg by mouth 3 (three) times daily as needed for seizure.    Marland Kitchen UNABLE TO FIND Med Name: Therapy M 9mg  -451mcg po daily     No facility-administered medications prior to visit.    PAST MEDICAL HISTORY: Past Medical History:  Diagnosis Date  .  Aplastic anemia ([place])   . Epilepsy ([place])   . Epilepsy without status epilepticus, not intractable ([place]) 06/04/2012  . Memory loss 06/04/2012  . Right hip pain 06/04/2012  . Tremor     PAST SURGICAL HISTORY: Past Surgical History:  Procedure Laterality Date  . CATARACT EXTRACTION    . CRANIOTOMY Right    frontal    FAMILY HISTORY: Family History  Problem Relation Age of Onset  . Parkinson's disease Mother        versus lewy body dementia  . Diabetes Other   . Seizures Other        grand mal  . Stroke Maternal Grandmother   . Heart disease Maternal Grandmother   . Stroke Maternal Grandfather   . Heart disease Maternal Grandfather   . Stroke Paternal Grandmother   . Heart disease Paternal Grandmother   . Stroke Paternal Grandfather   . Heart disease Paternal Grandfather   . Mental retardation Cousin     SOCIAL HISTORY: Social History   Socioeconomic History  . Marital status: Single    Spouse name: Not on file  . Number of children: 0  . Years of education: college  . Highest education level: Not on file  Occupational History  . Occupation: Doctor, general practice    Comment: resides in [employer]  . Smoking status: Current Every Day Smoker    Packs/day: 0.50    Years: 50.00    Pack years: 25.00    Types: Cigarettes  . Smokeless tobacco:  Former Systems developer    Quit date: 06/19/2012  Substance and Sexual Activity  . Alcohol use: No    Alcohol/week: 0.0 standard drinks  . Drug use: No  . Sexual activity: Not on file  Other Topics Concern  . Not on file  Social History Narrative   Patient is single and lives at [facility] living  At [place].    Patient does not have any children.   Patient is retired.   Patient has some college education.   Patient is right-handed.   Patient drinks four cups of coffee daily, one cup of soda daily.   Social Determinants of Health   Financial Resource Strain:   . Difficulty of Paying Living Expenses: Not on file  Food Insecurity:    . Worried About Charity fundraiser in the Last Year: Not on file  . Ran Out of Food in the Last Year: Not on file  Transportation Needs:   . Lack of Transportation (Medical): Not on file  . Lack of Transportation (Non-Medical): Not on file  Physical Activity:   . Days of Exercise per Week: Not on file  . Minutes of Exercise per Session: Not on file  Stress:   . Feeling of Stress : Not on file  Social Connections:   . Frequency of Communication with Friends and Family: Not on file  . Frequency of Social Gatherings with Friends and Family: Not on file  . Attends Religious Services: Not on file  . Active Member of Clubs or Organizations: Not on file  . Attends Archivist Meetings: Not on file  . Marital Status: Not on file  Intimate Partner Violence:   . Fear of Current or Ex-Partner: Not on file  . Emotionally Abused: Not on file  . Physically Abused: Not on file  . Sexually Abused: Not on file      PHYSICAL EXAM  Vitals:   10/09/19 1048  BP: 118/68  Pulse: 99  Weight: 221 lb (100.2 kg)  Height: 5\' 7"  (1.702 m)   Body mass index is 34.61 kg/m.  Generalized: Well developed, in no acute distress   Neurological examination  Mentation: Alert oriented to time, place, history taking. Follows all commands speech and language fluent Cranial nerve : very near sided - right Pupil larger than left, but both reactive to light and accomodation. Right ptosis.   Extraocular movements were full,  visual field were full on confrontational test.  Facial strength is normal. Uvula and tongue are in  midline.  Head turning and shoulder shrug  were normal and symmetric. Motor:  reveals full strength of all extremities, symmetric motor tone is noted throughout.  intact good finger-nose bilaterally.  Gait and station: Gait is stooped.   Tandem gait is normal. Romberg is negative ! No drift is seen.  Reflexes: Deep tendon reflexes are symmetric bilaterally.   DIAGNOSTIC DATA  (LABS, IMAGING, TESTING) - I reviewed patient records, labs, notes, testing and imaging myself where available.      Component Value Date/Time   NA 141 01/16/2018 1333   K 5.0 01/16/2018 1333   CL 101 01/16/2018 1333   CO2 24 01/16/2018 1333   GLUCOSE 72 01/16/2018 1333   BUN 14 01/16/2018 1333   CREATININE 0.70 (L) 01/16/2018 1333   CALCIUM 9.5 01/16/2018 1333   PROT 6.7 01/16/2018 1333   ALBUMIN 4.1 01/16/2018 1333   AST 18 01/16/2018 1333   ALT 25 01/16/2018 1333   ALKPHOS 66 01/16/2018 1333  BILITOT 0.2 01/16/2018 1333   GFRNONAA 96 01/16/2018 1333   GFRAA 111 01/16/2018 1333      ASSESSMENT AND PLAN 74 y.o. year old male  has a past medical history of Aplastic anemia ([place]), Epilepsy ([place]), Epilepsy without status epilepticus, not intractable ([place]) (06/04/2012), Memory loss (06/04/2012), Right hip pain (06/04/2012), and Tremor. here with follow up for controlled Epilepsy, recently had received a diagnosis of DM , diet controlled and metformin was started.    1. Seizures, continued following a brain surgery to treat epilepsy [facility]-  Dr Gloriann Loan ) . On Vimpat and Depakote and a former patient of Dr Erling Cruz.   The patient had a seizure event in December 2017 and none since then- he has been having episodes of dizziness ( vertigo )  which he reports have precipitated seizures in the past. He did well with vestibular therapy.  I will check blood work today again.  I will keep Depakote to 500 mg twice a day.  He will continue on Vimpat at same  dose- he feels that Vimpat made the difference.If the dizziness does return he should let us know or make his primary care provider aware. He will follow-up in 6 months with me, Dr. Brett Fairy.  Newly diagnosed diabetic:  So at this time I do not see any medication aside of valproate that would make him gain weight or increase his blood sugar level.  I like for him to stay on depakote , but change it to ER  1000 mg at dinner time-    I spent 25  minutes with the patient 50% of this time was spent discussing his  Vertigo, seizure events.   Larey Seat, MD  10/09/2019, 10:59 AM [facility] [address] [place], [place] [address] [phone]

## 2020-03-12 ENCOUNTER — Other Ambulatory Visit: Payer: Self-pay | Admitting: Neurology

## 2020-03-12 MED ORDER — DIVALPROEX SODIUM ER 500 MG PO TB24: 1000.0000 mg | ORAL_TABLET | Freq: Every day | ORAL | 0 refills | Status: DC

## 2020-03-25 ENCOUNTER — Ambulatory Visit (INDEPENDENT_AMBULATORY_CARE_PROVIDER_SITE_OTHER): Payer: Medicare Other | Admitting: Adult Health

## 2020-03-25 ENCOUNTER — Other Ambulatory Visit: Payer: Self-pay

## 2020-03-25 ENCOUNTER — Encounter: Payer: Self-pay | Admitting: Adult Health

## 2020-03-25 VITALS — BP 123/74 | HR 111 | Ht 66.0 in | Wt 203.0 lb

## 2020-03-25 DIAGNOSIS — G40209 Localization-related (focal) (partial) symptomatic epilepsy and epileptic syndromes with complex partial seizures, not intractable, without status epilepticus: Secondary | ICD-10-CM | POA: Diagnosis not present

## 2020-03-25 DIAGNOSIS — Z5181 Encounter for therapeutic drug level monitoring: Secondary | ICD-10-CM | POA: Diagnosis not present

## 2020-03-25 NOTE — Progress Notes (Signed)
PATIENT: Jordan Thomas DOB: 1945-09-20  REASON FOR VISIT: follow up HISTORY FROM: patient  HISTORY OF PRESENT ILLNESS: Today 03/25/20"  [name] is a 75 year old male with a history of seizures.  He returns today for follow-up.  He is currently on Depakote 2 tablets daily and Vimpat 200 mg twice a day.  He states that he has been waking up with sore lips each night for the past 2 weeks.  He feels that he may be having nocturnal seizure events.  There is been no witnessed events.  He lives in a group home.  Denies missing any medication.  He returns today for an evaluation.  HISTORY (Copied from Dr.Dohmeier's note) 10-09-2019;  I have the pleasure of meeting Ms. Jordan Thomas today who is a 75 year old Caucasian gentleman living in an extended care facility here in [place].  He has a longstanding history of a seizure disorder and is usually accompanied by a family member.  Overall he had no new seizure-like activity out of the ordinary, but he was just 2 month ago diagnosed with diabetes. He remains on Vimpat 200 mg tablets, he is on valproic acid 250 mg 2 tablets by mouth 2 times a day for seizures.  Metformin, omeprazole, multivitamin, levothyroxine sodium 100 mcg, alendronate, aspirin 81 mg chewable a day, his glucose was monitored twice daily at the facility, he uses melatonin to sleep, he has Lumigan eyedrops, Linzess capsules for irritable bowel, desmopressin for nocturia, finasteride for prostate hyperplasia,  Claritin as needed for sinus and allergies, atorvastatin for hypercholesterolemia and he can use Zofran for nausea and Tylenol for pain or fever.  So at this time I do not see any medication aside of valproate that would make him gain weight or increase his blood sugar level.   REVIEW OF SYSTEMS: Out of a complete 14 system review of symptoms, the patient complains only of the following symptoms, and all other reviewed systems are  negative.  ALLERGIES: Allergies  Allergen Reactions  . Aricept [Donepezil Hcl]   . Hydrocodone   . Ibuprofen   . Mesantoin [Mephenytoin]   . Tegretol [Carbamazepine]   . Topamax [Topiramate]   . Wellbutrin [Bupropion]   . Zonegran [Zonisamide]     HOME MEDICATIONS: Outpatient Medications Prior to Visit  Medication Sig Dispense Refill  . acetaminophen (TYLENOL) 325 MG tablet Take 650 mg by mouth every 6 (six) hours as needed.    Marland Kitchen alendronate (FOSAMAX) 70 MG tablet Take 70 mg by mouth once a week.     . AMOXICILLIN PO Take 1 tablet by mouth 3 times/day as needed-between meals & bedtime (Take 1 hour prior to dental appointment).    . Artificial Tear Ointment (ARTIFICIAL TEARS) ointment Place into both eyes 3 (three) times daily.    Marland Kitchen aspirin 81 MG tablet Take 81 mg by mouth daily.    Marland Kitchen atorvastatin (LIPITOR) 40 MG tablet Take 40 mg by mouth daily.    . bimatoprost (LUMIGAN) 0.01 % SOLN Place 1 drop into both eyes at bedtime.    . calcipotriene (DOVONOX) 0.005 % cream Apply 1 application topically 2 (two) times daily.    . Calcium Carb-Cholecalciferol (OYSTER SHELL CALCIUM + D PO) Take 1 tablet by mouth daily.    . clotrimazole (LOTRIMIN) 1 % cream Apply 1 application topically 2 (two) times daily. Apply to groin for fungal infection    . desmopressin (DDAVP) 0.2 MG tablet Take 0.6 mg by mouth daily.    . divalproex (  DEPAKOTE ER) 500 MG 24 hr tablet Take 2 tablets (1,000 mg total) by mouth daily. 60 tablet 0  . Emollient (MINERIN) LOTN Apply 1 application topically 2 (two) times daily.    Marland Kitchen EPINEPHrine 0.3 mg/0.3 mL IJ SOAJ injection Inject 0.3 mg into the muscle as needed.    . fesoterodine (TOVIAZ) 8 MG TB24 tablet Take 8 mg by mouth daily. (Patient not taking: Reported on 10/09/2019)    . finasteride (PROSCAR) 5 MG tablet Take 5 mg by mouth daily.     . halobetasol (ULTRAVATE) 0.05 % cream Apply 1 application topically 2 (two) times daily.    Marland Kitchen ketoconazole (NIZORAL) 2 % shampoo  Apply 1 application topically 2 (two) times a week. Monday and Friday    . lacosamide (VIMPAT) 200 MG TABS tablet Take 1 tablet (200 mg total) by mouth 2 (two) times daily. 180 tablet 3  . levothyroxine (SYNTHROID, LEVOTHROID) 100 MCG tablet 100 mcg daily before breakfast.     . linaclotide (LINZESS) 290 MCG CAPS capsule Take 290 mcg by mouth. Every 48 hours.    Marland Kitchen loperamide (IMODIUM) 2 MG capsule Take 2 mg by mouth as needed.     . loratadine (CLARITIN) 10 MG tablet Take 10 mg by mouth 2 (two) times daily.    . melatonin 3 MG TABS tablet Take 3 mg by mouth at bedtime.    . metFORMIN (GLUCOPHAGE) 500 MG tablet Take 500 mg by mouth 2 (two) times daily.    . Multiple Vitamin (MULTIVITAMIN) tablet Take 1 tablet by mouth daily.    Marland Kitchen nystatin cream (MYCOSTATIN) Apply 1 application topically daily.     Marland Kitchen omeprazole (PRILOSEC) 20 MG capsule     . ondansetron (ZOFRAN) 4 MG tablet Take 4 mg by mouth every 6 (six) hours as needed for nausea or vomiting.    . sodium fluoride (PREVIDENT 5000 PLUS) 1.1 % CREA dental cream Place 1 application onto teeth every evening.    . tadalafil (CIALIS) 5 MG tablet Take 5 mg by mouth daily as needed for erectile dysfunction. (Patient not taking: Reported on 10/09/2019)    . tamsulosin (FLOMAX) 0.4 MG CAPS capsule Take 0.4 mg by mouth daily.    Marland Kitchen triamcinolone cream (KENALOG) 0.1 % Apply 1 application topically daily.     No facility-administered medications prior to visit.    PAST MEDICAL HISTORY: Past Medical History:  Diagnosis Date  . Aplastic anemia ([place])   . Epilepsy ([place])   . 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 Memory loss 06/04/2012  . Right hip pain 06/04/2012  . Tremor     PAST SURGICAL HISTORY: Past Surgical History:  Procedure Laterality Date  . CATARACT EXTRACTION    . CRANIOTOMY Right    frontal    FAMILY HISTORY: Family History  Problem Relation Age of Onset  . Parkinson's disease Mother        versus  lewy body dementia  . Diabetes Other   . Seizures Other        grand mal  . Stroke Maternal Grandmother   . Heart disease Maternal Grandmother   . Stroke Maternal Grandfather   . Heart disease Maternal Grandfather   . Stroke Paternal Grandmother   . Heart disease Paternal Grandmother   . Stroke Paternal Grandfather   . Heart disease Paternal Grandfather   . Mental retardation Cousin     SOCIAL HISTORY: Social History   Socioeconomic History  . Marital status: Single  Spouse name: Not on file  . Number of children: 0  . Years of education: college  . Highest education level: Not on file  Occupational History  . Occupation: Doctor, general practice    Comment: resides in [place] [employer]  . Smoking status: Current Every Day Smoker    Packs/day: 0.50    Years: 50.00    Pack years: 25.00    Types: Cigarettes  . Smokeless tobacco: Former Systems developer    Quit date: 06/19/2012  Substance and Sexual Activity  . Alcohol use: No    Alcohol/week: 0.0 standard drinks  . Drug use: No  . Sexual activity: Not on file  Other Topics Concern  . Not on file  Social History Narrative   Patient is single and lives at [facility] [place].    Patient does not have any children.   Patient is retired.   Patient has some college education.   Patient is right-handed.   Patient drinks four cups of coffee daily, one cup of soda daily.   Social Determinants of Health   Financial Resource Strain: Not on file  Food Insecurity: Not on file  Transportation Needs: Not on file  Physical Activity: Not on file  Stress: Not on file  Social Connections: Not on file  Intimate Partner Violence: Not on file      PHYSICAL EXAM  Vitals:   03/25/20 1125  BP: 123/74  Pulse: (!) 111  Weight: 203 lb (92.1 kg)  Height: 5\' 6"  (1.676 m)   Body mass index is 32.77 kg/m.  Generalized: Well developed, in no acute distress   Neurological examination  Mentation: Alert oriented to time, place, history  taking. Follows all commands speech and language fluent Cranial nerve II-XII: Pupils were equal round reactive to light. Extraocular movements were full, visual field were full on confrontational test.  Head turning and shoulder shrug  were normal and symmetric.  No other sores or abrasions on the lips, inside the mouth or tongue. Motor: The motor testing reveals 5 over 5 strength of all 4 extremities. Good symmetric motor tone is noted throughout.  Sensory: Sensory testing is intact to soft touch on all 4 extremities. No evidence of extinction is noted.  Coordination: Cerebellar testing reveals good finger-nose-finger and heel-to-shin bilaterally.  Gait and station: Gait is normal.  Reflexes: Deep tendon reflexes are symmetric and normal bilaterally.   DIAGNOSTIC DATA (LABS, IMAGING, TESTING) - I reviewed patient records, labs, notes, testing and imaging myself where available.  Lab Results  Component Value Date   WBC 6.6 01/16/2018   HGB 12.4 (L) 01/16/2018   HCT 37.5 01/16/2018   MCV 91 01/16/2018   PLT 184 01/16/2018      Component Value Date/Time   NA 141 01/16/2018 1333   K 5.0 01/16/2018 1333   CL 101 01/16/2018 1333   CO2 24 01/16/2018 1333   GLUCOSE 72 01/16/2018 1333   BUN 14 01/16/2018 1333   CREATININE 0.70 (L) 01/16/2018 1333   CALCIUM 9.5 01/16/2018 1333   PROT 6.7 01/16/2018 1333   ALBUMIN 4.1 01/16/2018 1333   AST 18 01/16/2018 1333   ALT 25 01/16/2018 1333   ALKPHOS 66 01/16/2018 1333   BILITOT 0.2 01/16/2018 1333   GFRNONAA 96 01/16/2018 1333   GFRAA 111 01/16/2018 1333      ASSESSMENT AND PLAN 75 y.o. 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 here with:  1.  Seizures  --Continue Depakote at 1000 mg daily --Continue Vimpat 200 mg twice a day --Blood work today --Not sure if  the events he is describing is nocturnal seizures.  We will do an EEG tomorrow. --Advised if he has any seizure events he should let us know  Follow-up in 1 year or sooner if needed   I spent 25 minutes of face-to-face and non-face-to-face time with patient.  This included previsit chart review, lab review, study review, order entry, electronic health record documentation, patient education.  Ward Givens, MSN, NP-C 03/25/2020, 11:21 AM [facility] [address] [place], [place] [address] [phone]

## 2020-03-25 NOTE — Patient Instructions (Signed)
Your Plan:  Continue Depakote at 1000 mg daily Continue Vimpat 200 mg twice a day Blood work today If you have any seizure event she should let us know  Thank you for coming to see [place] at [facility]. I hope we have been able to provide you high quality care today.  You may receive a patient satisfaction survey over the next few weeks. We would appreciate your feedback and comments so that we may continue to improve ourselves and the health of our patients.

## 2020-03-26 ENCOUNTER — Ambulatory Visit (INDEPENDENT_AMBULATORY_CARE_PROVIDER_SITE_OTHER): Payer: Medicare Other

## 2020-03-26 DIAGNOSIS — G40209 Localization-related (focal) (partial) symptomatic epilepsy and epileptic syndromes with complex partial seizures, not intractable, without status epilepticus: Secondary | ICD-10-CM

## 2020-03-30 ENCOUNTER — Telehealth: Payer: Self-pay | Admitting: *Deleted

## 2020-03-30 LAB — CBC WITH DIFFERENTIAL/PLATELET
Basophils Absolute: 0 10*3/uL (ref 0.0–0.2)
Basos: 0 %
EOS (ABSOLUTE): 0 10*3/uL (ref 0.0–0.4)
Eos: 0 %
Hematocrit: 36 % — ABNORMAL LOW (ref 37.5–51.0)
Hemoglobin: 11.7 g/dL — ABNORMAL LOW (ref 13.0–17.7)
Immature Grans (Abs): 0.1 10*3/uL (ref 0.0–0.1)
Immature Granulocytes: 1 %
Lymphocytes Absolute: 2.1 10*3/uL (ref 0.7–3.1)
Lymphs: 37 %
MCH: 29.3 pg (ref 26.6–33.0)
MCHC: 32.5 g/dL (ref 31.5–35.7)
MCV: 90 fL (ref 79–97)
Monocytes Absolute: 0.8 10*3/uL (ref 0.1–0.9)
Monocytes: 14 %
Neutrophils Absolute: 2.7 10*3/uL (ref 1.4–7.0)
Neutrophils: 48 %
Platelets: 155 10*3/uL (ref 150–450)
RBC: 4 x10E6/uL — ABNORMAL LOW (ref 4.14–5.80)
RDW: 15.9 % — ABNORMAL HIGH (ref 11.6–15.4)
WBC: 5.7 10*3/uL (ref 3.4–10.8)

## 2020-03-30 LAB — COMPREHENSIVE METABOLIC PANEL
ALT: 50 IU/L — ABNORMAL HIGH (ref 0–44)
AST: 65 IU/L — ABNORMAL HIGH (ref 0–40)
Albumin/Globulin Ratio: 1.1 — ABNORMAL LOW (ref 1.2–2.2)
Albumin: 3.9 g/dL (ref 3.7–4.7)
Alkaline Phosphatase: 79 IU/L (ref 44–121)
BUN/Creatinine Ratio: 17 (ref 10–24)
BUN: 15 mg/dL (ref 8–27)
Bilirubin Total: 0.3 mg/dL (ref 0.0–1.2)
CO2: 25 mmol/L (ref 20–29)
Calcium: 9.4 mg/dL (ref 8.6–10.2)
Chloride: 102 mmol/L (ref 96–106)
Creatinine, Ser: 0.87 mg/dL (ref 0.76–1.27)
GFR calc Af Amer: 100 mL/min/{1.73_m2} (ref >59)
GFR calc non Af Amer: 86 mL/min/{1.73_m2} (ref >59)
Globulin, Total: 3.4 g/dL (ref 1.5–4.5)
Glucose: 105 mg/dL — ABNORMAL HIGH (ref 65–99)
Potassium: 4.6 mmol/L (ref 3.5–5.2)
Sodium: 144 mmol/L (ref 134–144)
Total Protein: 7.3 g/dL (ref 6.0–8.5)

## 2020-03-30 LAB — LACOSAMIDE: Lacosamide: 17.5 ug/mL — ABNORMAL HIGH (ref 5.0–10.0)

## 2020-03-30 LAB — VALPROIC ACID LEVEL: Valproic Acid Lvl: 77 ug/mL (ref 50–100)

## 2020-03-30 NOTE — Telephone Encounter (Signed)
-----   Message from Ward Givens, NP sent at 03/30/2020  1:26 PM EST ----- AST and ALT slightly elevated.  We will recheck in 2  months-could be due to Depakote

## 2020-03-30 NOTE — Telephone Encounter (Signed)
Did relay in Message that EEG normal, less likely nightly events are seizures.  Call back if questions.

## 2020-03-30 NOTE — Procedures (Signed)
   HISTORY: 75 year old male, with history of seizure  TECHNIQUE:  This is a routine 16 channel EEG recording with one channel devoted to a limited EKG recording.  It was performed during wakefulness, drowsiness and asleep.  Hyperventilation and photic stimulation were performed as activating procedures.  There are minimum muscle and movement artifact noted.  Upon maximum arousal, posterior dominant waking rhythm consistent of rhythmic alpha range activity. Activities are symmetric over the bilateral posterior derivations and attenuated with eye opening.  Hyperventilation produced mild/moderate buildup with higher amplitude and the slower activities noted.  Photic stimulation did not alter the tracing.  During EEG recording, patient developed drowsiness and no deeper stage of sleep was achieved  During EEG recording, there was no epileptiform discharge noted.  EKG demonstrate sinus rhythm, with heart rate of 72 bpm  CONCLUSION: This is a  normal awake EEG.  There is no electrodiagnostic evidence of epileptiform discharge.  Marcial Pacas, M.D. Ph.D.  [facility] [place], [place] [address] Phone: [phone] Fax:      [phone]

## 2020-03-30 NOTE — Telephone Encounter (Signed)
Patients sister Dorian Pod called back to get clarification on your voicemail about the night time seizures and EEG. The voicemail broke up. She would appreciate a call back at your earliest convenience.  Preferred number [phone].

## 2020-03-30 NOTE — Telephone Encounter (Signed)
Spoke to sister, ellen.  I relayed both EEG and lab results.  Repeat AST/ALT in 2 months.  Fax confirmation received to [facility] for pcp.  They may repeat this request.  Sister, ellen appreciated this.  [phone].

## 2020-03-30 NOTE — Telephone Encounter (Signed)
-----   Message from Ward Givens, NP sent at 03/30/2020  3:26 PM EST ----- EEG is normal. Less likely that the nightly events are seziures

## 2020-07-30 ENCOUNTER — Telehealth: Payer: Self-pay | Admitting: Oncology

## 2020-07-30 ENCOUNTER — Other Ambulatory Visit: Payer: Self-pay | Admitting: Oncology

## 2020-07-30 DIAGNOSIS — D649 Anemia, unspecified: Secondary | ICD-10-CM

## 2020-07-30 DIAGNOSIS — D539 Nutritional anemia, unspecified: Secondary | ICD-10-CM

## 2020-07-30 NOTE — Telephone Encounter (Signed)
Patient referred by Ronny Flurry, NP at [facility] for Abnormal Labs.  Appt made for 07/31/20 Labs 10:30 am - Consult 11:00 am

## 2020-07-31 ENCOUNTER — Encounter: Payer: Self-pay | Admitting: Oncology

## 2020-07-31 ENCOUNTER — Other Ambulatory Visit: Payer: Self-pay

## 2020-07-31 ENCOUNTER — Inpatient Hospital Stay (INDEPENDENT_AMBULATORY_CARE_PROVIDER_SITE_OTHER): Payer: Medicare Other | Admitting: Oncology

## 2020-07-31 ENCOUNTER — Telehealth: Payer: Self-pay | Admitting: Oncology

## 2020-07-31 ENCOUNTER — Inpatient Hospital Stay: Payer: Medicare Other | Attending: Oncology

## 2020-07-31 VITALS — BP 142/74 | HR 104 | Temp 98.0°F | Resp 16 | Ht 66.0 in | Wt 208.8 lb

## 2020-07-31 DIAGNOSIS — D649 Anemia, unspecified: Secondary | ICD-10-CM | POA: Insufficient documentation

## 2020-07-31 DIAGNOSIS — E119 Type 2 diabetes mellitus without complications: Secondary | ICD-10-CM | POA: Insufficient documentation

## 2020-07-31 DIAGNOSIS — D539 Nutritional anemia, unspecified: Secondary | ICD-10-CM

## 2020-07-31 DIAGNOSIS — E039 Hypothyroidism, unspecified: Secondary | ICD-10-CM | POA: Diagnosis not present

## 2020-07-31 DIAGNOSIS — Z79899 Other long term (current) drug therapy: Secondary | ICD-10-CM

## 2020-07-31 LAB — CBC AND DIFFERENTIAL
HCT: 35 — AB (ref 41–53)
Hemoglobin: 11.5 — AB (ref 13.5–17.5)
Neutrophils Absolute: 2.81
Platelets: 133 — AB (ref 150–399)
WBC: 5.2

## 2020-07-31 LAB — IRON AND TIBC
Iron: 95 ug/dL (ref 45–182)
Saturation Ratios: 24 % (ref 17.9–39.5)
TIBC: 397 ug/dL (ref 250–450)
UIBC: 302 ug/dL

## 2020-07-31 LAB — BASIC METABOLIC PANEL
BUN: 15 (ref 4–21)
CO2: 32 — AB (ref 13–22)
Chloride: 101 (ref 99–108)
Creatinine: 0.7 (ref 0.6–1.3)
Glucose: 135
Potassium: 3.9 (ref 3.4–5.3)
Sodium: 138 (ref 137–147)

## 2020-07-31 LAB — COMPREHENSIVE METABOLIC PANEL
Albumin: 4.2 (ref 3.5–5.0)
Calcium: 9.2 (ref 8.7–10.7)

## 2020-07-31 LAB — TSH: TSH: 2.477 u[IU]/mL (ref 0.350–4.500)

## 2020-07-31 LAB — VITAMIN B12: Vitamin B-12: 456 pg/mL (ref 180–914)

## 2020-07-31 LAB — FERRITIN: Ferritin: 170 ng/mL (ref 24–336)

## 2020-07-31 LAB — HEPATIC FUNCTION PANEL
ALT: 95 — AB (ref 10–40)
AST: 93 — AB (ref 14–40)
Alkaline Phosphatase: 78 (ref 25–125)
Bilirubin, Total: 0.4

## 2020-07-31 LAB — CBC: RBC: 3.69 — AB (ref 3.87–5.11)

## 2020-07-31 LAB — FOLATE: Folate: 47.1 ng/mL (ref >5.9)

## 2020-07-31 LAB — LACTATE DEHYDROGENASE: LDH: 150 U/L (ref 98–192)

## 2020-07-31 NOTE — Progress Notes (Signed)
[facility]  [address] [place],  [place]  [address] [phone]  Clinic Day:  07/31/2020  Referring physician: Bonnita Nasuti, MD   HISTORY OF PRESENT ILLNESS:  The patient is a 75 y.o. male who I was asked to consult upon for anemia.  Recent labs showed a low hemoglobin of 10.6.  The patient denies having any overt forms of blood loss to explain his anemia.  He had a colonoscopy in 2016, which showed no adverse findings.  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  He chronically takes Depakote for his known epilepsy.  To his knowledge, there is no family history of anemia or other hematologic disorders.  He denies having any acute changes in his health over these past few months.     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     alendronate (FOSAMAX) 70 MG tablet Take 70 mg by mouth once a week.      AMOXICILLIN PO Take 1 tablet by mouth 3 times/day as needed-between meals & bedtime (Take 1 hour prior to dental appointment).     Artificial Tear Ointment (ARTIFICIAL TEARS) ointment Place into both eyes 3 (three) times daily.     aspirin 81 MG tablet Take 81 mg by mouth daily.     atorvastatin (LIPITOR) 40 MG tablet Take 40 mg by mouth daily.     bimatoprost (LUMIGAN) 0.01 % SOLN Place 1 drop into  both eyes at bedtime.     calcipotriene (DOVONOX) 0.005 % cream Apply 1 application topically 2 (two) times daily.     Calcium Carb-Cholecalciferol (OYSTER SHELL CALCIUM + D PO) Take 1 tablet by mouth daily.     clotrimazole (LOTRIMIN) 1 % cream Apply 1 application topically 2 (two) times daily. Apply to groin for fungal infection     desmopressin (DDAVP) 0.2 MG tablet Take 0.6 mg by mouth daily.     divalproex (DEPAKOTE ER) 500 MG 24 hr tablet Take 2 tablets (1,000 mg total) by mouth daily. 60 tablet 0   Emollient (MINERIN) LOTN Apply 1 application topically 2 (two) times daily.     EPINEPHrine 0.3 mg/0.3 mL IJ SOAJ injection Inject 0.3 mg into the muscle as needed.     fesoterodine (TOVIAZ) 8 MG TB24 tablet Take 8 mg by mouth daily.     finasteride (PROSCAR) 5 MG tablet Take 5 mg by mouth daily.      halobetasol (ULTRAVATE) 0.05 % cream Apply 1 application topically 2 (two) times daily.     ketoconazole (NIZORAL) 2 % shampoo Apply 1 application topically 2 (two) times a week. Monday and Friday     lacosamide (VIMPAT) 200 MG TABS tablet Take 1 tablet (200 mg total) by mouth 2 (two) times daily. 180 tablet 3   levothyroxine (SYNTHROID,  LEVOTHROID) 100 MCG tablet 100 mcg daily before breakfast.      linaclotide (LINZESS) 290 MCG CAPS capsule Take 290 mcg by mouth. Every 48 hours.     loperamide (IMODIUM) 2 MG capsule Take 2 mg by mouth as needed.      loratadine (CLARITIN) 10 MG tablet Take 10 mg by mouth 2 (two) times daily.     melatonin 3 MG TABS tablet Take 3 mg by mouth at bedtime.     metFORMIN (GLUCOPHAGE) 500 MG tablet Take 500 mg by mouth 2 (two) times daily.     Multiple Vitamin (MULTIVITAMIN) tablet Take 1 tablet by mouth daily.     nystatin cream (MYCOSTATIN) Apply 1 application topically daily.      omeprazole (PRILOSEC) 20 MG capsule      ondansetron (ZOFRAN) 4 MG tablet Take 4 mg by mouth every 6 (six) hours as needed for nausea or vomiting.     sodium fluoride (PREVIDENT 5000  PLUS) 1.1 % CREA dental cream Place 1 application onto teeth every evening.     tadalafil (CIALIS) 5 MG tablet Take 5 mg by mouth daily as needed for erectile dysfunction.     tamsulosin (FLOMAX) 0.4 MG CAPS capsule Take 0.4 mg by mouth daily.     triamcinolone cream (KENALOG) 0.1 % Apply 1 application topically daily.     No current facility-administered medications for this visit.    ALLERGIES:   Allergies  Allergen Reactions   Aricept [Donepezil Hcl]    Hydrocodone    Ibuprofen    Mesantoin [Mephenytoin]    Tegretol [Carbamazepine]    Topamax [Topiramate]    Wellbutrin [Bupropion]    Zonegran [Zonisamide]     FAMILY HISTORY:   Family History  Problem Relation Age of Onset   Parkinson's disease Mother        versus lewy body dementia   Diabetes Other    Seizures Other        grand mal   Stroke Maternal Grandmother    Heart disease Maternal Grandmother    Stroke Maternal Grandfather    Heart disease Maternal Grandfather    Stroke Paternal Grandmother    Heart disease Paternal Grandmother    Stroke Paternal Grandfather    Heart disease Paternal Grandfather    Mental retardation Cousin     SOCIAL HISTORY:  The patient was born in [place].  He lives at a local residential facility.  He is not married and has no children.  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  He smoked over 59 years before quitting 2 years ago.  He drinks on rare occasions.    REVIEW OF SYSTEMS:  Review of Systems  Constitutional:  Negative for fatigue, fever and unexpected weight change.  Respiratory:  Negative for chest tightness, cough, hemoptysis and shortness of breath.   Cardiovascular:  Positive for chest pain. Negative for palpitations.  Gastrointestinal:  Positive for constipation. Negative for abdominal distention, abdominal pain, blood in stool, diarrhea, nausea and vomiting.  Genitourinary:  Negative for dysuria, frequency and hematuria.   Musculoskeletal:  Positive for gait  problem. Negative for arthralgias, back pain and myalgias.  Skin:  Positive for rash. Negative for itching.       Psoriasis   Neurological:  Positive for gait problem. Negative for dizziness, headaches and light-headedness.  Psychiatric/Behavioral:  Positive for depression. Negative for suicidal ideas. The patient is not nervous/anxious.     PHYSICAL EXAM:  Blood pressure (!) 142/74, pulse (!) 104,  temperature 98 F (36.7 C), resp. rate 16, height '5\' 6"'  (1.676 m), weight 208 lb 12.8 oz (94.7 kg), SpO2 95 %. Wt Readings from Last 3 Encounters:  07/31/20 208 lb 12.8 oz (94.7 kg)  03/25/20 203 lb (92.1 kg)  10/09/19 221 lb (100.2 kg)   Body mass index is 33.7 kg/m. Performance status (ECOG): 1 - Symptomatic but completely ambulatory Physical Exam Constitutional:      Appearance: Normal appearance. He is not ill-appearing.     Comments: Poor memory recollection  HENT:     Mouth/Throat:     Mouth: Mucous membranes are moist.     Pharynx: Oropharynx is clear. No oropharyngeal exudate or posterior oropharyngeal erythema.  Cardiovascular:     Rate and Rhythm: Normal rate and regular rhythm.     Heart sounds: No murmur heard.   No friction rub. No gallop.  Pulmonary:     Effort: Pulmonary effort is normal. No respiratory distress.     Breath sounds: Normal breath sounds. No wheezing, rhonchi or rales.  Chest:  Breasts:    Right: No axillary adenopathy or supraclavicular adenopathy.     Left: No axillary adenopathy or supraclavicular adenopathy.  Abdominal:     General: Bowel sounds are normal. There is no distension.     Palpations: Abdomen is soft. There is no mass.     Tenderness: There is no abdominal tenderness.  Musculoskeletal:        General: No swelling.     Right lower leg: No edema.     Left lower leg: No edema.  Lymphadenopathy:     Cervical: No cervical adenopathy.     Upper Body:     Right upper body: No supraclavicular or axillary adenopathy.     Left upper body:  No supraclavicular or axillary adenopathy.     Lower Body: No right inguinal adenopathy. No left inguinal adenopathy.  Skin:    General: Skin is warm.     Coloration: Skin is not jaundiced.     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 No lesion.  Neurological:     General: No focal deficit present.     Mental Status: He is alert and oriented to person, place, and time. Mental status is at baseline.     Cranial Nerves: Cranial nerves are intact.  Psychiatric:        Mood and Affect: Mood normal.        Behavior: Behavior normal.        Thought Content: Thought content normal.    LABS:   CBC Latest Ref Rng & Units 07/31/2020 03/25/2020 01/16/2018  WBC - 5.2 5.7 6.6  Hemoglobin 13.5 - 17.5 11.5(A) 11.7(L) 12.4(L)  Hematocrit 41 - 53 35(A) 36.0(L) 37.5  Platelets 150 - 399 133(A) 155 184   CMP Latest Ref Rng & Units 07/31/2020 03/25/2020 01/16/2018  Glucose 65 - 99 mg/dL - 105(H) 72  BUN 4 - '21 15 15 14  ' Creatinine 0.6 - 1.3 0.7 0.87 0.70(L)  Sodium 137 - 147 138 144 141  Potassium 3.4 - 5.3 3.9 4.6 5.0  Chloride 99 - 108 101 102 101  CO2 13 - 22 32(A) 25 24  Calcium 8.7 - 10.7 9.2 9.4 9.5  Total Protein 6.0 - 8.5 g/dL - 7.3 6.7  Total Bilirubin 0.0 - 1.2 mg/dL - 0.3 0.2  Alkaline Phos 25 - 125 78 79 66  AST 14 - 40 93(A) 65(H) 18  ALT 10 - 40 95(A) 50(H) 25  ASSESSMENT & PLAN:  A 74 y.o. male who I was asked to consult upon for anemia.  His hemoglobin of 11.5 is not much different than what it has been recently.  I will check his iron, B12, and folate levels to ensure there are no nutritional deficiencies factoring into his anemia.  His TSH will be checked to ensure severe thyroid disease is not factoring into his anemia.  A serum protein electrophoresis will be checked to ensure an underlying plasma cell dyscrasia, such as multiple myeloma, is not factoring into his anemia.  I will see this patient back in 1 month to go over his labs collected today, as well as recheck his  hemoglobin at that time.  The patient understands all the plans discussed today and is in agreement with them.  I do appreciate Hague, Rosalyn Charters, MD for his new consult.   Makenize Messman Macarthur Critchley, MD

## 2020-07-31 NOTE — Telephone Encounter (Signed)
Per 6/17 los next appt scheduled and given to patient

## 2020-08-03 LAB — PROTEIN ELECTROPHORESIS, SERUM
A/G Ratio: 1 (ref 0.7–1.7)
Albumin ELP: 3.6 g/dL (ref 2.9–4.4)
Alpha-1-Globulin: 0.2 g/dL (ref 0.0–0.4)
Alpha-2-Globulin: 0.7 g/dL (ref 0.4–1.0)
Beta Globulin: 1.2 g/dL (ref 0.7–1.3)
Gamma Globulin: 1.6 g/dL (ref 0.4–1.8)
Globulin, Total: 3.7 g/dL (ref 2.2–3.9)
Total Protein ELP: 7.3 g/dL (ref 6.0–8.5)

## 2020-08-24 ENCOUNTER — Telehealth: Payer: Self-pay

## 2020-08-24 NOTE — Telephone Encounter (Addendum)
I spoke with Dr Bobby Rumpf regarding the request of dermatologist to administer [place]. Dr Bobby Rumpf states, "I don't see any reason that the Jordan Thomas would affect his hemoglobin". Rodman Pickle notified.    Received a call from [facility]. The provider there would like to start pt on [place]ri[place]. Wants to know your opinion? [phone] ext [phone]

## 2020-08-28 NOTE — Progress Notes (Signed)
[facility]  [address] [place],  [place]  [address] [phone]  Clinic Day:  09/02/2020  Referring physician: Bonnita Nasuti, MD  This document serves as a record of services personally performed by Marice Potter, MD. It was created on their behalf by Curry,Lauren E, a trained medical scribe. The creation of this record is based on the scribe's personal observations and the provider's statements to them.  HISTORY OF PRESENT ILLNESS:  The patient is a 75 y.o. male who I recently began seeing for anemia.  He comes in today to go over all of his recent labs to determine the etiology behind this.  Since his last visit, the patient has been doing well.  He denies having increased fatigue or any overt forms of blood loss which concern him for progressive anemia.    PHYSICAL EXAM:  Blood pressure 130/71, pulse 96, temperature 98.2 F (36.8 C), temperature source Oral, resp. rate 18, height 5\' 6"  (1.676 m), weight 207 lb 3.2 oz (94 kg), SpO2 94 %. Wt Readings from Last 3 Encounters:  09/02/20 207 lb 3.2 oz (94 kg)  07/31/20 208 lb 12.8 oz (94.7 kg)  03/25/20 203 lb (92.1 kg)   Body mass index is 33.44 kg/m. Performance status (ECOG): 1 - Symptomatic but completely ambulatory Physical Exam Constitutional:      Appearance: Normal appearance. He is not ill-appearing.     Comments: Poor memory recollection  HENT:     Mouth/Throat:     Mouth: Mucous membranes are moist.     Pharynx: Oropharynx is clear. No oropharyngeal exudate or posterior oropharyngeal erythema.  Cardiovascular:     Rate and Rhythm: Normal rate and regular rhythm.     Heart sounds: No murmur heard.   No friction rub. No gallop.  Pulmonary:     Effort: Pulmonary effort is normal. No respiratory distress.     Breath sounds: Normal breath sounds. No wheezing, rhonchi or rales.  Chest:  Breasts:    Right: No axillary adenopathy or supraclavicular adenopathy.     Left: No axillary  adenopathy or supraclavicular adenopathy.  Abdominal:     General: Bowel sounds are normal. There is no distension.     Palpations: Abdomen is soft. There is no mass.     Tenderness: There is no abdominal tenderness.  Musculoskeletal:        General: No swelling.     Right lower leg: No edema.     Left lower leg: No edema.  Lymphadenopathy:     Cervical: No cervical adenopathy.     Upper Body:     Right upper body: No supraclavicular or axillary adenopathy.     Left upper body: No supraclavicular or axillary adenopathy.     Lower Body: No right inguinal adenopathy. No left inguinal adenopathy.  Skin:    General: Skin is warm.     Coloration: Skin is not jaundiced.     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 No lesion.  Neurological:     General: No focal deficit present.     Mental Status: He is alert and oriented to person, place, and time. Mental status is at baseline.     Cranial Nerves: Cranial nerves are intact.  Psychiatric:        Mood and Affect: Mood normal.        Behavior: Behavior normal.        Thought Content: Thought content normal.    LABS:  CBC Latest Ref Rng & Units 09/02/2020 07/31/2020 03/25/2020  WBC - 4.7 5.2 5.7  Hemoglobin 13.5 - 17.5 11.9(A) 11.5(A) 11.7(L)  Hematocrit 41 - 53 36(A) 35(A) 36.0(L)  Platelets 150 - 399 151 133(A) 155   CMP Latest Ref Rng & Units 07/31/2020 03/25/2020 01/16/2018  Glucose 65 - 99 mg/dL - 105(H) 72  BUN 4 - 21 15 15 14   Creatinine 0.6 - 1.3 0.7 0.87 0.70(L)  Sodium 137 - 147 138 144 141  Potassium 3.4 - 5.3 3.9 4.6 5.0  Chloride 99 - 108 101 102 101  CO2 13 - 22 32(A) 25 24  Calcium 8.7 - 10.7 9.2 9.4 9.5  Total Protein 6.0 - 8.5 g/dL - 7.3 6.7  Total Bilirubin 0.0 - 1.2 mg/dL - 0.3 0.2  Alkaline Phos 25 - 125 78 79 66  AST 14 - 40 93(A) 65(H) 18  ALT 10 - 40 95(A) 50(H) 25   Iron 45 - 182 ug/dL 95   TIBC 250 - 450 ug/dL 397   Saturation Ratios 17.9 - 39.5 % 24   UIBC ug/dL 302    Ferritin 24 - 336  ng/mL 170    Vitamin B-12 180 - 914 pg/mL 456    Folate >5.9 ng/mL 47.1    TSH 0.350 - 4.500 uIU/mL 2.477    Total Protein ELP 6.0 - 8.5 g/dL 7.3         Albumin ELP 2.9 - 4.4 g/dL 3.6         Alpha-1-Globulin 0.0 - 0.4 g/dL 0.2         Alpha-2-Globulin 0.4 - 1.0 g/dL 0.7         Beta Globulin 0.7 - 1.3 g/dL 1.2         Gamma Globulin 0.4 - 1.8 g/dL 1.6         M-Spike, % Not Observed g/dL Not Observed          ASSESSMENT & PLAN:  A 75 y.o. male with anemia.  When reviewing all of his recent labs, there is no plausible etiology behind his mild anemia.  I am pleased as his hemoglobin is better at 11.9.  As he is clinically doing well, his mild anemia will be followed conservatively.  On another note, this gentleman's liver enzymes continue to rise.  It will be important for him to be screened for viral hepatitis, as well as potential fatty liver disease.  Ultimately, he may need to be see by a GI/liver specialist for his elevated liver enzymes.  Otherwise, I will see him back in 4 months for repeat clinical assessment.  The patient understands all the plans discussed today and is in agreement with them.   I, Rita Ohara, am acting as scribe for Marice Potter, MD    I have reviewed this report as typed by the medical scribe, and it is complete and accurate.  Duayne Brideau Macarthur Critchley, MD

## 2020-08-31 ENCOUNTER — Inpatient Hospital Stay: Payer: Medicare Other | Admitting: Oncology

## 2020-08-31 ENCOUNTER — Inpatient Hospital Stay: Payer: Medicare Other

## 2020-09-02 ENCOUNTER — Encounter: Payer: Self-pay | Admitting: Oncology

## 2020-09-02 ENCOUNTER — Other Ambulatory Visit: Payer: Self-pay

## 2020-09-02 ENCOUNTER — Other Ambulatory Visit: Payer: Self-pay | Admitting: Oncology

## 2020-09-02 ENCOUNTER — Inpatient Hospital Stay: Payer: Medicare Other | Attending: Oncology

## 2020-09-02 ENCOUNTER — Inpatient Hospital Stay (INDEPENDENT_AMBULATORY_CARE_PROVIDER_SITE_OTHER): Payer: Medicare Other | Admitting: Oncology

## 2020-09-02 DIAGNOSIS — D649 Anemia, unspecified: Secondary | ICD-10-CM

## 2020-09-02 DIAGNOSIS — D539 Nutritional anemia, unspecified: Secondary | ICD-10-CM

## 2020-09-02 LAB — CBC: RBC: 3.87 (ref 3.87–5.11)

## 2020-09-02 LAB — CBC AND DIFFERENTIAL
HCT: 36 — AB (ref 41–53)
Hemoglobin: 11.9 — AB (ref 13.5–17.5)
Neutrophils Absolute: 2.07
Platelets: 151 (ref 150–399)
WBC: 4.7

## 2020-09-05 DIAGNOSIS — I491 Atrial premature depolarization: Secondary | ICD-10-CM | POA: Diagnosis not present

## 2020-09-05 DIAGNOSIS — R000 Tachycardia, unspecified: Secondary | ICD-10-CM | POA: Diagnosis not present

## 2020-09-07 DIAGNOSIS — R000 Tachycardia, unspecified: Secondary | ICD-10-CM | POA: Diagnosis not present

## 2020-09-07 DIAGNOSIS — I491 Atrial premature depolarization: Secondary | ICD-10-CM | POA: Diagnosis not present

## 2020-09-08 DIAGNOSIS — I471 Supraventricular tachycardia: Secondary | ICD-10-CM

## 2020-09-08 DIAGNOSIS — U071 COVID-19: Secondary | ICD-10-CM

## 2020-09-08 DIAGNOSIS — D649 Anemia, unspecified: Secondary | ICD-10-CM

## 2020-09-08 DIAGNOSIS — R000 Tachycardia, unspecified: Secondary | ICD-10-CM | POA: Diagnosis not present

## 2020-09-08 DIAGNOSIS — J449 Chronic obstructive pulmonary disease, unspecified: Secondary | ICD-10-CM

## 2020-09-09 DIAGNOSIS — I471 Supraventricular tachycardia: Secondary | ICD-10-CM | POA: Diagnosis not present

## 2020-09-09 DIAGNOSIS — R000 Tachycardia, unspecified: Secondary | ICD-10-CM | POA: Diagnosis not present

## 2020-09-09 DIAGNOSIS — D649 Anemia, unspecified: Secondary | ICD-10-CM | POA: Diagnosis not present

## 2020-09-09 DIAGNOSIS — J449 Chronic obstructive pulmonary disease, unspecified: Secondary | ICD-10-CM | POA: Diagnosis not present

## 2020-12-23 ENCOUNTER — Telehealth: Payer: Self-pay | Admitting: Adult Health

## 2020-12-23 NOTE — Telephone Encounter (Signed)
Will attempt PA

## 2020-12-23 NOTE — Telephone Encounter (Signed)
Pt's sister is asking for a call to discuss pt being informed that lacosamide (VIMPAT) 200 MG TABS tablet will not be covered by his insurance next year.  Pt's sister states  [facility] [employer] can be called at [phone] (provider line) to discuss this

## 2020-12-23 NOTE — Telephone Encounter (Signed)
Attempted [employer] PA for Vimpat 200 mg. Key: [id number]  Outcome: The patient currently has access to the requested medication and a Prior Authorization is not needed for the patient/medication.

## 2020-12-24 NOTE — Telephone Encounter (Signed)
Received this message again when trying to complete new PA: The patient currently has access to the requested medication and a Prior Authorization is not needed for the patient/medication.

## 2020-12-24 NOTE — Telephone Encounter (Signed)
Pt's sister has called back to inform that she has spoken with the insurance company and they are now going to cover the medication (60 pills for 30 days) for all of 2023

## 2020-12-24 NOTE — Telephone Encounter (Signed)
I called sister relayed what we have done and she will call insurance and give fax # for [place] to fax whatever it takes to get the approval for 2023.  She appreciated call back.

## 2020-12-24 NOTE — Telephone Encounter (Signed)
Noted  

## 2020-12-24 NOTE — Telephone Encounter (Signed)
Spoke with Louis at [employer] to complete PA for 2023 and I was advised this would need to be done on Cover  [employer]. He created a KEY for [place] [id number].

## 2020-12-28 NOTE — Progress Notes (Signed)
[facility]  [address] [place],  [place]  [address] [phone]  Clinic Day:  01/04/2021  Referring physician: Bonnita Nasuti, MD  This document serves as a record of services personally performed by Marice Potter, MD. It was created on their behalf by Curry,Lauren E, a trained medical scribe. The creation of this record is based on the scribe's personal observations and the provider's statements to them.  HISTORY OF PRESENT ILLNESS:  The patient is a 75 y.o. male who I recently began seeing for anemia.  He comes in today to reassess his hemoglobin.  Since his last visit, the patient has been doing well.  He denies having increased fatigue which concern him for progressive anemia.  However, he claims he has black stools intermittently over the past month.  PHYSICAL EXAM:  Blood pressure 133/69, pulse 88, temperature 98 F (36.7 C), resp. rate 18, height 5\' 6"  (1.676 m), weight 202 lb 3.2 oz (91.7 kg), SpO2 96 %. Wt Readings from Last 3 Encounters:  01/04/21 202 lb 3.2 oz (91.7 kg)  09/02/20 207 lb 3.2 oz (94 kg)  07/31/20 208 lb 12.8 oz (94.7 kg)   Body mass index is 32.64 kg/m. Performance status (ECOG): 1 - Symptomatic but completely ambulatory Physical Exam Constitutional:      Appearance: Normal appearance. He is not ill-appearing.     Comments: Poor memory recollection  HENT:     Mouth/Throat:     Mouth: Mucous membranes are moist.     Pharynx: Oropharynx is clear. No oropharyngeal exudate or posterior oropharyngeal erythema.  Cardiovascular:     Rate and Rhythm: Normal rate and regular rhythm.     Heart sounds: No murmur heard.   No friction rub. No gallop.  Pulmonary:     Effort: Pulmonary effort is normal. No respiratory distress.     Breath sounds: Normal breath sounds. No wheezing, rhonchi or rales.  Abdominal:     General: Bowel sounds are normal. There is no distension.     Palpations: Abdomen is soft. There is no mass.      Tenderness: There is no abdominal tenderness.  Musculoskeletal:        General: No swelling.     Right lower leg: No edema.     Left lower leg: No edema.  Lymphadenopathy:     Cervical: No cervical adenopathy.     Upper Body:     Right upper body: No supraclavicular or axillary adenopathy.     Left upper body: No supraclavicular or axillary adenopathy.     Lower Body: No right inguinal adenopathy. No left inguinal adenopathy.  Skin:    General: Skin is warm.     Coloration: Skin is not jaundiced.     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 No lesion.  Neurological:     General: No focal deficit present.     Mental Status: He is alert and oriented to person, place, and time. Mental status is at baseline.  Psychiatric:        Mood and Affect: Mood normal.        Behavior: Behavior normal.        Thought Content: Thought content normal.    LABS:   CBC Latest Ref Rng & Units 01/04/2021 09/02/2020 07/31/2020  WBC - 6.3 4.7 5.2  Hemoglobin 13.5 - 17.5 11.2(A) 11.9(A) 11.5(A)  Hematocrit 41 - 53 33(A) 36(A) 35(A)  Platelets 150 - 399 172 151 133(A)  CMP Latest Ref Rng & Units 01/04/2021 07/31/2020 03/25/2020  Glucose 65 - 99 mg/dL - - 105(H)  BUN 4 - 21 16 15 15   Creatinine 0.6 - 1.3 0.6 0.7 0.87  Sodium 137 - 147 137 138 144  Potassium 3.4 - 5.3 4.2 3.9 4.6  Chloride 99 - 108 102 101 102  CO2 13 - 22 29(A) 32(A) 25  Calcium 8.7 - 10.7 9.0 9.2 9.4  Total Protein 6.0 - 8.5 g/dL - - 7.3  Total Bilirubin 0.0 - 1.2 mg/dL - - 0.3  Alkaline Phos 25 - 125 80 78 79  AST 14 - 40 70(A) 93(A) 65(H)  ALT 10 - 40 55(A) 95(A) 50(H)    Latest Reference Range & Units 01/04/21 14:48 01/04/21 14:51  Iron 45 - 182 ug/dL 76   UIBC ug/dL 254   TIBC 250 - 450 ug/dL 330   Saturation Ratios 17.9 - 39.5 % 23   Ferritin 24 - 336 ng/mL  200    ASSESSMENT & PLAN:  A 75 y.o. male with mild anemia.  Although his hemoglobin is lower than his last visit, it remains above 10-11.   Furthermore, his labs are not consistent with iron deficiency.  Hematologically, he is holding relatively stable.  I will see him back in 6 months for repeat clinical assessment.  The patient understands all the plans discussed today and is in agreement with them.   I, Rita Ohara, am acting as scribe for Marice Potter, MD    I have reviewed this report as typed by the medical scribe, and it is complete and accurate.  Tyannah Sane Macarthur Critchley, MD

## 2021-01-04 ENCOUNTER — Other Ambulatory Visit: Payer: Self-pay | Admitting: Hematology and Oncology

## 2021-01-04 ENCOUNTER — Telehealth: Payer: Self-pay | Admitting: Oncology

## 2021-01-04 ENCOUNTER — Other Ambulatory Visit: Payer: Self-pay

## 2021-01-04 ENCOUNTER — Inpatient Hospital Stay: Payer: Medicare Other

## 2021-01-04 ENCOUNTER — Inpatient Hospital Stay: Payer: Medicare Other | Attending: Oncology | Admitting: Oncology

## 2021-01-04 ENCOUNTER — Other Ambulatory Visit: Payer: Self-pay | Admitting: Oncology

## 2021-01-04 VITALS — BP 133/69 | HR 88 | Temp 98.0°F | Resp 18 | Ht 66.0 in | Wt 202.2 lb

## 2021-01-04 DIAGNOSIS — D649 Anemia, unspecified: Secondary | ICD-10-CM | POA: Diagnosis present

## 2021-01-04 DIAGNOSIS — D539 Nutritional anemia, unspecified: Secondary | ICD-10-CM

## 2021-01-04 LAB — CBC: RBC: 3.56 — AB (ref 3.87–5.11)

## 2021-01-04 LAB — BASIC METABOLIC PANEL
BUN: 16 (ref 4–21)
CO2: 29 — AB (ref 13–22)
Chloride: 102 (ref 99–108)
Creatinine: 0.6 (ref 0.6–1.3)
Glucose: 103
Potassium: 4.2 (ref 3.4–5.3)
Sodium: 137 (ref 137–147)

## 2021-01-04 LAB — IRON AND TIBC
Iron: 76 ug/dL (ref 45–182)
Saturation Ratios: 23 % (ref 17.9–39.5)
TIBC: 330 ug/dL (ref 250–450)
UIBC: 254 ug/dL

## 2021-01-04 LAB — COMPREHENSIVE METABOLIC PANEL
Albumin: 3.8 (ref 3.5–5.0)
Calcium: 9 (ref 8.7–10.7)

## 2021-01-04 LAB — CBC AND DIFFERENTIAL
HCT: 33 — AB (ref 41–53)
Hemoglobin: 11.2 — AB (ref 13.5–17.5)
Neutrophils Absolute: 3.15
Platelets: 172 (ref 150–399)
WBC: 6.3

## 2021-01-04 LAB — HEPATIC FUNCTION PANEL
ALT: 55 — AB (ref 10–40)
AST: 70 — AB (ref 14–40)
Alkaline Phosphatase: 80 (ref 25–125)
Bilirubin, Total: 0.2

## 2021-01-04 LAB — FERRITIN: Ferritin: 200 ng/mL (ref 24–336)

## 2021-01-04 NOTE — Telephone Encounter (Signed)
Per 11/21 los next appt scheduled and given to patient 

## 2021-01-05 ENCOUNTER — Telehealth: Payer: Self-pay

## 2021-01-05 NOTE — Telephone Encounter (Signed)
I notified Jolayne Haines, facility staff @ [facility] of Dr Bobby Rumpf assessment and plan for pt (see below). She verbalized understanding.      Latest Reference Range & Units 01/04/21 14:48 01/04/21 14:51  Iron 45 - 182 ug/dL 76    UIBC ug/dL 254    TIBC 250 - 450 ug/dL 330    Saturation Ratios 17.9 - 39.5 % 23    Ferritin 24 - 336 ng/mL   200      ASSESSMENT & PLAN:  A 75 y.o. male with mild anemia.  Although his hemoglobin is lower than his last visit, it remains above 10-11.  Furthermore, his labs are not consistent with iron deficiency.  Hematologically, he is holding relatively stable.  I will see him back in 6 months for repeat clinical assessment.  The patient understands all the plans discussed today and is in agreement with them.     I, Rita Ohara, am acting as scribe for Marice Potter, MD     I have reviewed this report as typed by the medical scribe, and it is complete and accurate.   Dequincy Macarthur Critchley, MD

## 2021-03-29 ENCOUNTER — Encounter: Payer: Self-pay | Admitting: Neurology

## 2021-03-29 ENCOUNTER — Ambulatory Visit (INDEPENDENT_AMBULATORY_CARE_PROVIDER_SITE_OTHER): Payer: Medicare Other | Admitting: Neurology

## 2021-03-29 VITALS — BP 127/77 | HR 86 | Ht 66.0 in | Wt 209.0 lb

## 2021-03-29 DIAGNOSIS — G40209 Localization-related (focal) (partial) symptomatic epilepsy and epileptic syndromes with complex partial seizures, not intractable, without status epilepticus: Secondary | ICD-10-CM

## 2021-03-29 DIAGNOSIS — Z5181 Encounter for therapeutic drug level monitoring: Secondary | ICD-10-CM

## 2021-03-29 DIAGNOSIS — Z79899 Other long term (current) drug therapy: Secondary | ICD-10-CM | POA: Diagnosis not present

## 2021-03-29 DIAGNOSIS — L409 Psoriasis, unspecified: Secondary | ICD-10-CM

## 2021-03-29 MED ORDER — LACOSAMIDE 200 MG PO TABS: 200.0000 mg | ORAL_TABLET | Freq: Two times a day (BID) | ORAL | 3 refills | Status: DC

## 2021-03-29 MED ORDER — DIVALPROEX SODIUM ER 500 MG PO TB24: 1000.0000 mg | ORAL_TABLET | Freq: Every day | ORAL | 5 refills | Status: DC

## 2021-03-29 NOTE — Progress Notes (Signed)
PATIENT: Jordan Thomas DOB: 1945-08-15  REASON FOR VISIT: follow up HISTORY FROM: patient  HISTORY OF PRESENT ILLNESS:    Interval history: Pt is here  with sister, rm 34. Here from group home for follow up visit.  Pt is reporting that he is having seizures at night. States that it was either last night or the 11th that he had last one. (Or felt like it was last one).  Sister not sure these truly are seizures. Patient here  for breakthrough seizures many of the not witnessed and therefore it is not clear how long he may lose awareness, and if he suffered a seizure at all.  He feels that they are occurring every night. He felt this was a concern at his last visit too. Last EEG was completed 03/26/20 by Dr Krista Blue, none since. Read as normal.  He reports biting his lip and tongue in his sleep- frequently. His Depakote levels were just checked- and were therapeutic , reportedly normal. Sister is calling the faclity from here to get information about depakote ( VPA) levels.  He has elevated liver transaminases. Going on for 12 months.      12-23-2020: visit with Jordan Givens, NP. She ordered labs. The patient is followed by hematology.  Dr Bobby Rumpf, follows for macrocytic anemia. He has ankle surgery in July 2022.   10-09-2019;  I have the pleasure of meeting Ms. Jordan Thomas today who is a 77 year old Caucasian gentleman living in an extended care facility here in [place].  He has a longstanding history of a seizure disorder and is usually accompanied by a family member.  Overall he had no new seizure-like activity out of the ordinary, but he was just 2 month ago diagnosed with diabetes. He remains on Vimpat 200 mg tablets, he is on valproic acid 250 mg 2 tablets by mouth 2 times a day for seizures.  Metformin, omeprazole, multivitamin, levothyroxine sodium 100 mcg, alendronate, aspirin 81 mg chewable a day, his glucose was monitored twice daily at the facility, he uses  melatonin to sleep, he has Lumigan eyedrops, Linzess capsules for irritable bowel, desmopressin for nocturia, finasteride for prostate hyperplasia,  Claritin as needed for sinus and allergies, atorvastatin for hypercholesterolemia and he can use Zofran for nausea and Tylenol for pain or fever.  So at this time I do not see any medication aside of valproate that would make him gain weight or increase his blood sugar level.  I like for him to stay on depakote , but change it to ER  1000 mg at dinner time-       Video: [id number] CD-[name] is a 76 year old resident at an extended care facility here in [place], [place].  With a longstanding history of a seizure disorder which has been well controlled on Vimpat and Depakote, he was independent in most activities of daily living but he did develop memory loss and does not drive.  He had some orthopedic procedures within the last 12 months but his facility went into Covid related lockdown and he has not had physical therapy since March.    01-21-2019 :He reports that he suffered a seizure about a month ago either October or November while he was alone in his room.  He also states he suffered a tongue bite but no incontinence.  He did not alert any members of the staff.  He denies skipping any medications and his caretakers are delivering the medication on time and usually watched  watch the patient taking it.  Prior to that seizure that he subjectively reported the last seizure was in October 2019.   Curious about the coincidence that the seizures happened in October I asked if he had suffered any upper respiratory infection, inflammation, febrile illness, any medication changes, sleep deprivation or if he was taking decongestants.  He denied all of these possibilities and his caretaker also did.    Observations/Objective: Patient for severe breakthrough seizures many of the not witnessed and therefore it is not clear how long he may lose  awareness, and if he suffered a seizure at all.  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  This condition is treated with topical creams.  I confirm that he is currently on alendronate-Fosamax, artificial tears, Lumigan eyedrops, calcium, Lotrimin cream, Depakote 250 mg 2 in the morning 2 at night, Proscar 5 mg daily Vimpat 200 mg by mouth 2 times daily and he is taking levothyroxine 100 mcg daily before breakfast, Linzess, Imodium as needed, Claritin as needed, Ativan 1 mg as needed for seizures.  Nystatin, omeprazole, Zofran as needed, Cialis as needed, tamsulosin as needed, Lipitor 10 mg every second day.  He  The patient has a history of aplastic anemia, epilepsy with secondary generalization, without status epilepticus.  Progressive memory loss, right hip pain, essential tremor medication related.  He denies losing taste or smell.  He was seated throughout the whole observation on video.  Last labs in my records were from 2019.    Interval history from 58 November 2018.  I have the pleasure of meeting today with [name] and his sister. The patient has done exceptionally well on Vimpat and Depakote, is independent in ADLs, his last seizure spell was in December one year ago.  He did have Some complaint of vertigo over the last year which resolved with vestibular rehab.  He continues to take his medication regularly, residing at [facility].  He gets assistance with his oral medication.    [name] is a 76 year old male with a history of seizures. He returns today for follow-up. He continues on Depakote 250 mg 1 tablet in the morning and 2 tablets at bedtime. He also is on Vimpat. He reports that he has not had any recent seizures. He reports that he had a seizure approximately one month after his last visit with Dr. Brett Fairy. However he does not remember if there was anything that triggered the event. He states that he never misses his  medication. He continues to live at [facility]. He is able to complete all ADLs independently. He does not operate a motor vehicle. He reports episodes of dizziness that occurs approximately 3 times a day. It is not associated with positional changes. He states that the dizziness makes him feel as if he will have a seizure but the seizure never comes. He states that this is the same sensation he has gotten in the past prior to a seizure event. His primary care did a carotid Doppler that showed no hemodynamically significant ICA stenosis. He is also been referred to ENT for an evaluation. He returns today for an evaluation.   HISTORY 06/17/15: [name] is a 76 year old male with seizures. He returns today for follow-up. He continues to take Depakote and Vimpat. He is tolerating these medications well. He states that he has not had a seizure since Halloween. He continues to live at [facility]. He is able to complete  all ADLs independently. He does not operate a motor vehicle. Denies any changes with her gait or balance. He denies any new neurological symptoms. He returns today for an evaluation.   HISTORY (Jordan Thomas): [name] is a 76 year old  caucasian male with a history of seizures.  He returns today for follow-up. He is currently taking Depakote and Vimpat. Patient states that he had two seizures in December. He states that he has grand mal seizures. He denies missing any medication. He does state that his mom passed in December and he is not sure if the stress from that contributed to his seizures. He also has a roommate that keeps him up at night. He states that his roommate gets up and walks around during the night. He feels that he does not sleep well due to this. He goes to bed around midnight and wakes at 6:00 AM.    He currently lives at [facility] and reports that he has requested a new room mate but that so far as not happened.   Since [name] lives with a roommate at his facility he is  dependent on the sleep cycles of others to yesterday for example he could only go to bed at about 4 AM and then slept until 7. He is here today for routine visit and refills. He had no seizures since the last 6 years. I have recommended to discontinue or wean off Wellbutrin as it lowered the seizure threshold. He had also used Chantix for smoking cessation. Family had 2 seizures in December , and his dacility still hesitated  discontinuing that medication. His gait has not further changed. He feels stable. He is treated for epilepsy he is treated for glaucoma with Lumigan eyedrops, he has a history of falls his last fall was 18 months ago after his hip replacement. He is a much more stable gait since then. Patient has had 2 seizures in December. I had  increased Vimpat to 200 mg BID. I will check blood work. This will be completed at his facility since he has already taken his medication this morning. I have requested that his facility offer him a new room/ roommate so the patient can get better sleep.    REVIEW OF SYSTEMS: Out of a complete 14 system review of symptoms, the patient complains only of the following symptoms, and all other reviewed systems are negative.  Patient for severe breakthrough seizures many of the not witnessed and therefore it is not clear how long he may lose awareness, and if he suffered a seizure at all.   ALLERGIES: Allergies  Allergen Reactions   Bupropion     Other reaction(s): Other (See Comments)   Carbamazepine     Other reaction(s): Other (See Comments)   Codeine     Other reaction(s): Confusion (intolerance)   Donepezil Hcl     Other reaction(s): Other (See Comments)   Hydrocodone    Ibuprofen Diarrhea   Mephenytoin     Other reaction(s): Other (See Comments)   Topiramate     Other reaction(s): Other (See Comments)   Zonisamide     Other reaction(s): Other (See Comments)    HOME MEDICATIONS: Outpatient Medications Prior to Visit  Medication Sig  Dispense Refill   alendronate (FOSAMAX) 70 MG tablet Take 70 mg by mouth once a week.      AMOXICILLIN PO Take 1 tablet by mouth 3 times/day as needed-between meals & bedtime (Take 1 hour prior to dental appointment).  Artificial Tear Ointment (ARTIFICIAL TEARS) ointment Place into both eyes 3 (three) times daily.     aspirin 81 MG tablet Take 81 mg by mouth daily.     bimatoprost (LUMIGAN) 0.01 % SOLN Place 1 drop into both eyes at bedtime.     calcipotriene (DOVONOX) 0.005 % cream Apply 1 application topically 2 (two) times daily.     Calcium Carb-Cholecalciferol (OYSTER SHELL CALCIUM + D PO) Take 1 tablet by mouth daily.     divalproex (DEPAKOTE ER) 500 MG 24 hr tablet Take 2 tablets (1,000 mg total) by mouth daily. 60 tablet 0   EPINEPHrine 0.3 mg/0.3 mL IJ SOAJ injection Inject 0.3 mg into the muscle as needed.     finasteride (PROSCAR) 5 MG tablet Take 5 mg by mouth daily.      furosemide (LASIX) 40 MG tablet Take 40 mg by mouth daily.     halobetasol (ULTRAVATE) 0.05 % cream Apply 1 application topically 2 (two) times daily.     ketoconazole (NIZORAL) 2 % shampoo Apply 1 application topically 2 (two) times a week. Monday and Friday     lacosamide (VIMPAT) 200 MG TABS tablet Take 1 tablet (200 mg total) by mouth 2 (two) times daily. 180 tablet 3   levothyroxine (SYNTHROID, LEVOTHROID) 100 MCG tablet 100 mcg daily before breakfast.      linaclotide (LINZESS) 290 MCG CAPS capsule Take 290 mcg by mouth. Every 48 hours.     loratadine (CLARITIN) 10 MG tablet Take 10 mg by mouth 2 (two) times daily.     melatonin 3 MG TABS tablet Take 3 mg by mouth at bedtime.     metFORMIN (GLUCOPHAGE) 500 MG tablet Take 500 mg by mouth 2 (two) times daily.     mirabegron ER (MYRBETRIQ) 50 MG TB24 tablet Take 50 mg by mouth daily.     Multiple Vitamin (MULTIVITAMIN) tablet Take 1 tablet by mouth daily.     nystatin cream (MYCOSTATIN) Apply 1 application topically daily.      omeprazole (PRILOSEC) 20 MG  capsule      ondansetron (ZOFRAN) 4 MG tablet Take 4 mg by mouth every 6 (six) hours as needed for nausea or vomiting.     sodium fluoride (PREVIDENT 5000 PLUS) 1.1 % CREA dental cream Place 1 application onto teeth every evening.     tamsulosin (FLOMAX) 0.4 MG CAPS capsule Take 0.4 mg by mouth daily.     triamcinolone cream (KENALOG) 0.1 % Apply 1 application topically daily.     No facility-administered medications prior to visit.    PAST MEDICAL HISTORY: Past Medical History:  Diagnosis Date   Aplastic anemia ([place])    Epilepsy ([place])    Epilepsy without status epilepticus, not intractable ([facility]) 06/04/2012   Memory loss 06/04/2012   Right hip pain 06/04/2012   Tremor     PAST SURGICAL HISTORY: Past Surgical History:  Procedure Laterality Date   CATARACT EXTRACTION     CRANIOTOMY Right    frontal    FAMILY HISTORY: Family History  Problem Relation Age of Onset   Parkinson's disease Mother        versus lewy body dementia   Diabetes Other    Seizures Other        grand mal   Stroke Maternal Grandmother    Heart disease Maternal Grandmother    Stroke Maternal Grandfather    Heart disease Maternal Grandfather    Stroke Paternal Grandmother    Heart disease Paternal Grandmother  Stroke Paternal Grandfather    Heart disease Paternal Grandfather    Mental retardation Cousin     SOCIAL HISTORY: Social History   Socioeconomic History   Marital status: Single    Spouse name: Not on file   Number of children: 0   Years of education: college   Highest education level: Not on file  Occupational History   Occupation: Doctor, general practice    Comment: resides in [place] Use   Smoking status: Former    Packs/day: 0.50    Years: 50.00    Pack years: 25.00    Types: Cigarettes   Smokeless tobacco: Former    Quit date: 06/19/2012  Substance and Sexual Activity   Alcohol use: No    Alcohol/week: 0.0 standard drinks   Drug use: No   Sexual activity: Not Currently   Other Topics Concern   Not on file  Social History Narrative   Patient is single and lives at [place].    Patient does not have any children.   Patient is retired.   Patient has some college education.   Patient is right-handed.   Patient drinks four cups of coffee daily, one cup of soda daily.   Social Determinants of Health   Financial Resource Strain: Not on file  Food Insecurity: Not on file  Transportation Needs: Not on file  Physical Activity: Not on file  Stress: Not on file  Social Connections: Not on file  Intimate Partner Violence: Not on file      PHYSICAL EXAM  Vitals:   03/29/21 1013  BP: 127/77  Pulse: 86  Weight: 209 lb (94.8 kg)  Height: 5\' 6"  (1.676 m)   Body mass index is 33.73 kg/m.  Generalized: Well developed, in no acute distress   Neurological examination  Mentation: Alert oriented to time, place, history taking. Follows all commands speech and language fluent Cranial nerve : very near sided - right Pupil larger than left, but both reactive to light and accomodation. Right ptosis.  Long standing. Right forehead has no wrinkles, left does. Bells palsy?   Extraocular movements were full,  visual field were full on confrontational test.  Facial strength is normal. Uvula and tongue remain midline.  Head turning and shoulder shrug  were normal and symmetric. Motor:  reveals full strength of all extremities, symmetric motor tone is noted throughout.  intact finger-nose bilaterally.  Gait and station:  stooped.   Tandem gait is normal. Romberg is negative ! No drift is seen.  Reflexes: Deep tendon reflexes are symmetric bilaterally, 2 plus upper DTRS.   DIAGNOSTIC DATA (LABS, IMAGING, TESTING) - I reviewed patient records, labs, notes, testing and imaging myself where available.  I am awaiting a phone call with results of recent VPA levels.         Component Value Date/Time   NA 137 01/04/2021 0000   K 4.2 01/04/2021  0000   CL 102 01/04/2021 0000   CO2 29 (A) 01/04/2021 0000   GLUCOSE 105 (H) 03/25/2020 1155   BUN 16 01/04/2021 0000   CREATININE 0.6 01/04/2021 0000   CREATININE 0.87 03/25/2020 1155   CALCIUM 9.0 01/04/2021 0000   PROT 7.3 03/25/2020 1155   ALBUMIN 3.8 01/04/2021 0000   ALBUMIN 3.9 03/25/2020 1155   AST 70 (A) 01/04/2021 0000   ALT 55 (A) 01/04/2021 0000   ALKPHOS 80 01/04/2021 0000   BILITOT 0.3 03/25/2020 1155   GFRNONAA 86 03/25/2020 1155   GFRAA  100 03/25/2020 1155      ASSESSMENT AND PLAN 76 y.o. year old male  has a past medical history of Aplastic anemia ([place]), Epilepsy ([facility]), Epilepsy without status epilepticus, not intractable ([facility]) (06/04/2012), Memory loss (06/04/2012), Right hip pain (06/04/2012), and Tremor. here with follow up for controlled Epilepsy, r had received a diagnosis of DM , diet controlled and metformin was started.    1. Seizures, continued following a brain surgery to treat epilepsy [facility]-  Dr Gloriann Loan ) . On Vimpat and Depakote and a former patient of Dr Erling Cruz.   The patient had a witnessed seizure event in December 2017 and none since then- he has been having episodes of dizziness ( vertigo )  which he reports have precipitated seizures in the past. He did well with vestibular therapy.    I will check blood work today again.  I will keep Depakote VPA- at 1000 mg  at night , XR form  He will continue on Vimpat at same  dose- he feels that Vimpat made the difference.If the dizziness does return he should let us know or make his primary care provider aware.   He will follow-up in 6 months with NP.   I like for him to stay on depakote , but change it to ER 1000 mg at dinner time-    I spent 25 minutes with the patient 50% of this time was spent discussing his possible seizure events.   Larey Seat, MD  03/29/2021, 10:47 AM [facility] [address], [place], [place] [address] [phone]

## 2021-03-29 NOTE — Patient Instructions (Signed)
NOCTURNAL Epilepsy Epilepsy is when a person keeps having seizures. A seizure is a burst of abnormal activity in the brain. This condition can cause problems such as: A change in how you think or behave. Trouble knowing what is happening. Falls, accidents, and injury. Sadness (depression). Poor memory. In rare cases, this condition can be life-threatening. But most people with epilepsy lead normal lives. What are the causes? A head injury or an injury that happens at birth. A high fever during childhood. A stroke. Bleeding into or around the brain. Some medicines and drugs. Having too little oxygen for a long time. Abnormal brain development. Conditions such as: Brain infection. Brain tumors. Conditions that are passed from parent to child. Many times, the cause is not known. What are the signs or symptoms? Symptoms of a seizure vary from person to person. They may include: Symptoms during a seizure Shaking with fast, jerky movements of muscles (convulsions). Stiffness of the body. Breathing problems. Being mixed up (confused). Staring or being hard to wake up (being unresponsive). Head nodding, eye blinking, eye twitching, or fast eye movements. Drooling, grunting, or making clicking sounds with your mouth. Not being able to control when you pee or poop. Symptoms before a seizure Feeling afraid, worried, or nervous. Feeling like you may vomit. Vertigo. This feels like: You are moving when you are not. Things around you are moving when they are not. Dj vu. This is a feeling of having seen or heard something before. Odd tastes or smells. Changes in how you see, such as seeing flashing lights or spots. Symptoms after a seizure Being confused. Being sleepy. A headache. Sore muscles. How is this treated? Treatment can control seizures. It may include: Taking medicines. Having a device put in the chest (vagus nerve stimulator). Brain surgery. Having blood tests  often. Eating foods that are low in carbohydrates and high in fat (ketogenic diet). If you are diagnosed with this condition, you should start treatment as soon as you can. Follow these instructions at home: Medicines Take over-the-counter and prescription medicines only as told by your doctor. Avoid anything that may keep your medicine from working, such as alcohol. Activity Get enough rest. Follow your doctor's advice about driving, swimming, and doing other things that would be dangerous if you had a seizure. If you live in the [place] ask your local department of motor vehicles about local driving laws for people with epilepsy. Teaching others  Teach friends and family what to do if you have a seizure. Tell them to: Help you get down to the ground. Put a pillow under your head and body. Loosen any clothing around your neck. Turn you on your side. Stay with you until you are better. Know whether or not you need emergency care. Also, tell them what not to do if you have a seizure. Tell them: They should not hold you down. They should not put anything in your mouth. General instructions Avoid things that cause you to have seizures. Keep a seizure diary. Write down: What you remember about each seizure. What might have caused the seizure. Keep all follow-up visits. Where to find more information Epilepsy Foundation: epilepsy.com International League Against Epilepsy: ilae.org Contact a doctor if: You have a change in how often or when you have seizures. You get an infection or start to feel sick. You are not able to take your medicine. Get help right away if: A seizure does not stop after 5 minutes. You have more than one seizure in  a row, and you do not have enough time between the seizures to feel better. A seizure makes it harder to breathe. A seizure is different from other seizures you have had. A seizure makes you unable to speak or use a part of your body. You did not  wake up right away after a seizure. You feel sad, and this does not get better. These symptoms may be an emergency. Get help right away. Call your local emergency services (911 in the [place] Do not wait to see if the symptoms will go away. Do not drive yourself to the hospital. Get help right awayif you feel like you may hurt yourself or others, or have thoughts about taking your own life. Go to your nearest emergency room or: Call your local emergency services (911 in the [place] Call the [facility] at 614-860-9985 or 988 in the [place] This is open 24 hours a day. Text the Crisis Text Line at (305)165-3453. Summary Epilepsy is when a person keeps having seizures. Seizures can cause many symptoms, such as brief staring and shaking or jerky muscle movements. Treatment can control seizures. Take over-the-counter and prescription medicines only as told by your doctor. Follow your doctor's advice about driving, swimming, and doing other things that would be dangerous if you had a seizure. Teach friends and family what to do if you have a seizure. This information is not intended to replace advice given to you by your health care provider. Make sure you discuss any questions you have with your health care provider. Document Revised: 08/26/2020 Document Reviewed: 08/05/2019 Elsevier Patient Education  [place]. Valproic Acid, Divalproex Sodium delayed or extended-release tablets What is this medication? DIVALPROEX SODIUM (dye VAL pro ex  SO dee um) is used to prevent seizures caused by some forms of epilepsy. It is also used to treat bipolar mania and to prevent migraine headaches. This medicine may be used for other purposes; ask your health care provider or pharmacist if you have questions. COMMON BRAND NAME(S): Depakote, Depakote ER What should I tell my care team before I take this medication? They need to know if you have any of these conditions: if you often drink  alcohol kidney disease liver disease low platelet counts mitochondrial disease suicidal thoughts, plans, or attempt; a previous suicide attempt by you or a family member urea cycle disorder (UCD) an unusual or allergic reaction to divalproex sodium, sodium valproate, valproic acid, other medicines, foods, dyes, or preservatives pregnant or trying to get pregnant breast-feeding How should I use this medication? Take this medicine by mouth with a drink of water. Follow the directions on the prescription label. Do not cut, crush or chew this medicine. You can take it with or without food. If it upsets your stomach, take it with food. Take your medicine at regular intervals. Do not take it more often than directed. Do not stop taking except on your doctor's advice. A special MedGuide will be given to you by the pharmacist with each prescription and refill. Be sure to read this information carefully each time. Talk to your pediatrician regarding the use of this medicine in children. While this drug may be prescribed for children as young as 10 years for selected conditions, precautions do apply. Overdosage: If you think you have taken too much of this medicine contact a poison control center or emergency room at once. NOTE: This medicine is only for you. Do not share this medicine with others. What if I miss  a dose? If you miss a dose, take it as soon as you can. If it is almost time for your next dose, take only that dose. Do not take double or extra doses. What may interact with this medication? Do not take this medicine with any of the following medications: sodium phenylbutyrate This medicine may also interact with the following medications: aspirin certain antibiotics like ertapenem, imipenem, meropenem certain medicines for depression, anxiety, or psychotic disturbances certain medicines for seizures like carbamazepine, clonazepam, diazepam, ethosuximide, felbamate, lamotrigine,  phenobarbital, phenytoin, primidone, rufinamide, topiramate certain medicines that treat or prevent blood clots like warfarin cholestyramine male hormones, like estrogens and birth control pills, patches, or rings propofol rifampin ritonavir tolbutamide zidovudine This list may not describe all possible interactions. Give your health care provider a list of all the medicines, herbs, non-prescription drugs, or dietary supplements you use. Also tell them if you smoke, drink alcohol, or use illegal drugs. Some items may interact with your medicine. What should I watch for while using this medication? Tell your doctor or health care provider if your symptoms do not get better or they start to get worse. This medicine may cause serious skin reactions. They can happen weeks to months after starting the medicine. Contact your health care provider right away if you notice fevers or flu-like symptoms with a rash. The rash may be red or purple and then turn into blisters or peeling of the skin. Or, you might notice a red rash with swelling of the face, lips or lymph nodes in your neck or under your arms. Wear a medical ID bracelet or chain, and carry a card that describes your disease and details of your medicine and dosage times. You may get drowsy, dizzy, or have blurred vision. Do not drive, use machinery, or do anything that needs mental alertness until you know how this medicine affects you. To reduce dizzy or fainting spells, do not sit or stand up quickly, especially if you are an older patient. Alcohol can increase drowsiness and dizziness. Avoid alcoholic drinks. This medicine can make you more sensitive to the sun. Keep out of the sun. If you cannot avoid being in the sun, wear protective clothing and use sunscreen. Do not use sun lamps or tanning beds/booths. Patients and their families should watch out for new or worsening depression or thoughts of suicide. Also watch out for sudden changes in  feelings such as feeling anxious, agitated, panicky, irritable, hostile, aggressive, impulsive, severely restless, overly excited and hyperactive, or not being able to sleep. If this happens, especially at the beginning of treatment or after a change in dose, call your health care provider. Women should inform their doctor if they wish to become pregnant or think they might be pregnant. There is a potential for serious side effects to an unborn child. Talk to your health care provider or pharmacist for more information. Women who become pregnant while using this medicine may enroll in the [place] Pregnancy Registry by calling [phone]. This registry collects information about the safety of antiepileptic drug use during pregnancy. This medicine may cause a decrease in folic acid and vitamin D. You should make sure that you get enough vitamins while you are taking this medicine. Discuss the foods you eat and the vitamins you take with your health care provider. What side effects may I notice from receiving this medication? Side effects that you should report to your doctor or health care professional as soon as possible: allergic reactions  like skin rash, itching or hives, swelling of the face, lips, or tongue changes in vision rash, fever, and swollen lymph nodes redness, blistering, peeling or loosening of the skin, including inside the mouth signs and symptoms of liver injury like dark yellow or brown urine; general ill feeling or flu-like symptoms; light-colored stools; loss of appetite; nausea; right upper belly pain; unusually weak or tired; yellowing of the eyes or skin suicidal thoughts or other mood changes unusual bleeding or bruising Side effects that usually do not require medical attention (report to your doctor or health care professional if they continue or are bothersome): constipation diarrhea dizziness hair loss headache loss of appetite weight  gain This list may not describe all possible side effects. Call your doctor for medical advice about side effects. You may report side effects to FDA at 1-800-FDA-1088. Where should I keep my medication? Keep out of reach of children. Store at room temperature between 15 and 30 degrees C (59 and 86 degrees F). Keep container tightly closed. Throw away any unused medicine after the expiration date. NOTE: This sheet is a summary. It may not cover all possible information. If you have questions about this medicine, talk to your doctor, pharmacist, or health care provider.  2022 Elsevier/Gold Standard (2018-05-16 00:00:00)

## 2021-03-30 ENCOUNTER — Ambulatory Visit: Payer: Medicare Other | Admitting: Adult Health

## 2021-03-30 LAB — COMPREHENSIVE METABOLIC PANEL
ALT: 57 IU/L — ABNORMAL HIGH (ref 0–44)
AST: 57 IU/L — ABNORMAL HIGH (ref 0–40)
Albumin/Globulin Ratio: 1.3 (ref 1.2–2.2)
Albumin: 4 g/dL (ref 3.7–4.7)
Alkaline Phosphatase: 82 IU/L (ref 44–121)
BUN/Creatinine Ratio: 18 (ref 10–24)
BUN: 11 mg/dL (ref 8–27)
Bilirubin Total: <0.2 mg/dL (ref 0.0–1.2)
CO2: 30 mmol/L — ABNORMAL HIGH (ref 20–29)
Calcium: 9.5 mg/dL (ref 8.6–10.2)
Chloride: 103 mmol/L (ref 96–106)
Creatinine, Ser: 0.61 mg/dL — ABNORMAL LOW (ref 0.76–1.27)
Globulin, Total: 3 g/dL (ref 1.5–4.5)
Glucose: 105 mg/dL — ABNORMAL HIGH (ref 70–99)
Potassium: 4.6 mmol/L (ref 3.5–5.2)
Sodium: 142 mmol/L (ref 134–144)
Total Protein: 7 g/dL (ref 6.0–8.5)
eGFR: 101 mL/min/{1.73_m2} (ref >59)

## 2021-03-30 LAB — VALPROIC ACID LEVEL: Valproic Acid Lvl: 50 ug/mL (ref 50–100)

## 2021-04-01 ENCOUNTER — Encounter: Payer: Self-pay | Admitting: Neurology

## 2021-04-14 ENCOUNTER — Ambulatory Visit (INDEPENDENT_AMBULATORY_CARE_PROVIDER_SITE_OTHER): Payer: Medicare Other | Admitting: Neurology

## 2021-04-14 DIAGNOSIS — L409 Psoriasis, unspecified: Secondary | ICD-10-CM

## 2021-04-14 DIAGNOSIS — Z79899 Other long term (current) drug therapy: Secondary | ICD-10-CM

## 2021-04-14 DIAGNOSIS — Z5181 Encounter for therapeutic drug level monitoring: Secondary | ICD-10-CM

## 2021-04-14 DIAGNOSIS — G40209 Localization-related (focal) (partial) symptomatic epilepsy and epileptic syndromes with complex partial seizures, not intractable, without status epilepticus: Secondary | ICD-10-CM | POA: Diagnosis not present

## 2021-04-19 NOTE — Procedures (Signed)
? ? ?  History: ? ?76 year old man with seizure  ? ?EEG classification: Awake and drowsy ? ?Description of the recording: The background rhythms of this recording consists of a fairly well modulated medium amplitude alpha rhythm of up to 9 Hz that is reactive to eye opening and closure. As the record progresses, the patient appears to remain in the waking state throughout the recording. Photic stimulation was performed, did not show any abnormalities. Hyperventilation was also performed, did not show any abnormalities. Toward the end of the recording, the patient enters the drowsy state with slight symmetric slowing seen. The patient never enters stage II sleep. No abnormal epileptiform discharges seen during this recording. There was no focal slowing. EKG monitor shows no evidence of cardiac rhythm abnormalities with a heart rate of 84. ? ?There was an event where patient described jerks in upper body, with no changes in EEG background.  ? ?Impression: This is a normal EEG recording in the waking and drowsy state. No evidence of interictal epileptiform discharges seen. A normal EEG does not exclude a diagnosis of epilepsy.  ? ? ?Alric Ran, MD ?[facility] ?  ?

## 2021-04-20 ENCOUNTER — Encounter: Payer: Self-pay | Admitting: Neurology

## 2021-04-20 NOTE — Progress Notes (Signed)
Impression: This is a normal EEG recording in the waking and drowsy state. No evidence of interictal epileptiform discharges seen. A normal EEG does not exclude a diagnosis of epilepsy.

## 2021-07-04 NOTE — Progress Notes (Signed)
[facility]  [address] [place],  [place]  [address] [phone]  Clinic Day: 07/05/2021  Referring physician: Bonnita Nasuti, MD  HISTORY OF PRESENT ILLNESS:  The patient is a 76 y.o. male who I recently began seeing for anemia.  He comes in today to reassess his hemoglobin.  Since his last visit, the patient has been doing well.  He denies having increased fatigue or any overt forms of blood loss which concern him for progressive anemia.    PHYSICAL EXAM:  Blood pressure (!) 153/70, pulse 95, temperature 98.7 F (37.1 C), resp. rate 16, height '5\' 6"'$  (1.676 m), weight 208 lb 11.2 oz (94.7 kg), SpO2 98 %. Wt Readings from Last 3 Encounters:  07/05/21 208 lb 11.2 oz (94.7 kg)  03/29/21 209 lb (94.8 kg)  01/04/21 202 lb 3.2 oz (91.7 kg)   Body mass index is 33.69 kg/m. Performance status (ECOG): 1 - Symptomatic but completely ambulatory Physical Exam Constitutional:      Appearance: Normal appearance. He is not ill-appearing.     Comments: Poor memory recollection  HENT:     Mouth/Throat:     Mouth: Mucous membranes are moist.     Pharynx: Oropharynx is clear. No oropharyngeal exudate or posterior oropharyngeal erythema.  Cardiovascular:     Rate and Rhythm: Normal rate and regular rhythm.     Heart sounds: No murmur heard.   No friction rub. No gallop.  Pulmonary:     Effort: Pulmonary effort is normal. No respiratory distress.     Breath sounds: Normal breath sounds. No wheezing, rhonchi or rales.  Abdominal:     General: Bowel sounds are normal. There is no distension.     Palpations: Abdomen is soft. There is no mass.     Tenderness: There is no abdominal tenderness.  Musculoskeletal:        General: No swelling.     Right lower leg: No edema.     Left lower leg: No edema.  Lymphadenopathy:     Cervical: No cervical adenopathy.     Upper Body:     Right upper body: No supraclavicular or axillary adenopathy.     Left upper body:  No supraclavicular or axillary adenopathy.     Lower Body: No right inguinal adenopathy. No left inguinal adenopathy.  Skin:    General: Skin is warm.     Coloration: Skin is not jaundiced.     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 No lesion.  Neurological:     General: No focal deficit present.     Mental Status: He is alert and oriented to person, place, and time. Mental status is at baseline.  Psychiatric:        Mood and Affect: Mood normal.        Behavior: Behavior normal.        Thought Content: Thought content normal.    LABS:      Latest Ref Rng & Units 07/05/2021   12:00 AM 01/04/2021   12:00 AM 09/02/2020   12:00 AM  CBC  WBC  5.1      6.3      4.7    Hemoglobin 13.5 - 17.5 11.8      11.2      11.9    Hematocrit 41 - 53 37      33      36    Platelets 150 - 400 K/uL 195  172      151       This result is from an external source.      Latest Ref Rng & Units 07/05/2021   12:00 AM 03/29/2021   11:20 AM 01/04/2021   12:00 AM  CMP  Glucose 70 - 99 mg/dL  105     BUN 4 - '21 21      11   16       '$ Creatinine 0.6 - 1.3 0.8      0.61   0.6       Sodium 137 - 147 142      142   137       Potassium 3.5 - 5.1 mEq/L 4.1      4.6   4.2       Chloride 99 - 108 101      103   102       CO2 13 - '22 30      30   29       '$ Calcium 8.7 - 10.7 9.2      9.5   9.0       Total Protein 6.0 - 8.5 g/dL  7.0     Total Bilirubin 0.0 - 1.2 mg/dL  <0.2     Alkaline Phos 25 - 125 47      82   80       AST 14 - 40 59      57   70       ALT 10 - 40 U/L 53      57   55          This result is from an external source.    Latest Reference Range & Units 07/05/21 13:11  Iron 45 - 182 ug/dL 94  UIBC ug/dL 350  TIBC 250 - 450 ug/dL 444  Saturation Ratios 17.9 - 39.5 % 21  Ferritin 24 - 336 ng/mL 324  Folate >5.9 ng/mL 14.0  Vitamin B12 180 - 914 pg/mL 271   ASSESSMENT & PLAN:  A 75 y.o. male with mild anemia.  Although his hemoglobin is still low, it is better than what  it was at his last visit.  Furthermore, labs today did not show any evidence of nutritional deficiency being present.  Clinically, this gentleman is doing well.  Based upon that, I will see him back in 6 months for repeat clinical assessment.  The patient understands all the plans discussed today and is in agreement with them.  Lecia Esperanza Macarthur Critchley, MD

## 2021-07-05 ENCOUNTER — Telehealth: Payer: Self-pay | Admitting: Oncology

## 2021-07-05 ENCOUNTER — Other Ambulatory Visit: Payer: Self-pay

## 2021-07-05 ENCOUNTER — Other Ambulatory Visit: Payer: Self-pay | Admitting: Oncology

## 2021-07-05 ENCOUNTER — Inpatient Hospital Stay: Payer: Medicare Other

## 2021-07-05 ENCOUNTER — Inpatient Hospital Stay: Payer: Medicare Other | Attending: Oncology | Admitting: Oncology

## 2021-07-05 VITALS — BP 153/70 | HR 95 | Temp 98.7°F | Resp 16 | Ht 66.0 in | Wt 208.7 lb

## 2021-07-05 DIAGNOSIS — D539 Nutritional anemia, unspecified: Secondary | ICD-10-CM

## 2021-07-05 DIAGNOSIS — D649 Anemia, unspecified: Secondary | ICD-10-CM | POA: Diagnosis present

## 2021-07-05 LAB — IRON AND TIBC
Iron: 94 ug/dL (ref 45–182)
Saturation Ratios: 21 % (ref 17.9–39.5)
TIBC: 444 ug/dL (ref 250–450)
UIBC: 350 ug/dL

## 2021-07-05 LAB — FOLATE: Folate: 14 ng/mL (ref >5.9)

## 2021-07-05 LAB — FERRITIN: Ferritin: 324 ng/mL (ref 24–336)

## 2021-07-05 LAB — HEPATIC FUNCTION PANEL
ALT: 53 U/L — AB (ref 10–40)
AST: 59 — AB (ref 14–40)
Alkaline Phosphatase: 47 (ref 25–125)
Bilirubin, Total: 0.4

## 2021-07-05 LAB — BASIC METABOLIC PANEL
BUN: 21 (ref 4–21)
CO2: 30 — AB (ref 13–22)
Chloride: 101 (ref 99–108)
Creatinine: 0.8 (ref 0.6–1.3)
Glucose: 100
Potassium: 4.1 mEq/L (ref 3.5–5.1)
Sodium: 142 (ref 137–147)

## 2021-07-05 LAB — CBC AND DIFFERENTIAL
HCT: 37 — AB (ref 41–53)
Hemoglobin: 11.8 — AB (ref 13.5–17.5)
Neutrophils Absolute: 2.5
Platelets: 195 10*3/uL (ref 150–400)
WBC: 5.1

## 2021-07-05 LAB — CBC: RBC: 3.73 — AB (ref 3.87–5.11)

## 2021-07-05 LAB — VITAMIN B12: Vitamin B-12: 271 pg/mL (ref 180–914)

## 2021-07-05 LAB — COMPREHENSIVE METABOLIC PANEL
Albumin: 4.4 (ref 3.5–5.0)
Calcium: 9.2 (ref 8.7–10.7)

## 2021-07-05 NOTE — Telephone Encounter (Signed)
Per 07/05/21 los next appt scheduled and confirmed with patient 

## 2021-09-27 ENCOUNTER — Ambulatory Visit: Payer: Medicare Other | Admitting: Adult Health

## 2021-10-19 ENCOUNTER — Ambulatory Visit (INDEPENDENT_AMBULATORY_CARE_PROVIDER_SITE_OTHER): Payer: Medicare Other | Admitting: Neurology

## 2021-10-19 ENCOUNTER — Encounter: Payer: Self-pay | Admitting: Neurology

## 2021-10-19 VITALS — BP 107/70 | HR 88 | Ht 66.0 in | Wt 210.0 lb

## 2021-10-19 DIAGNOSIS — Z79899 Other long term (current) drug therapy: Secondary | ICD-10-CM

## 2021-10-19 DIAGNOSIS — G40209 Localization-related (focal) (partial) symptomatic epilepsy and epileptic syndromes with complex partial seizures, not intractable, without status epilepticus: Secondary | ICD-10-CM | POA: Insufficient documentation

## 2021-10-19 DIAGNOSIS — G3184 Mild cognitive impairment, so stated: Secondary | ICD-10-CM | POA: Diagnosis not present

## 2021-10-19 NOTE — Patient Instructions (Signed)
Lacosamide Tablets What is this medication? LACOSAMIDE (la KOE sa mide) prevents and controls seizures in people with epilepsy. It works by calming overactive nerves in your body. This medicine may be used for other purposes; ask your health care provider or pharmacist if you have questions. COMMON BRAND NAME(S): Vimpat What should I tell my care team before I take this medication? They need to know if you have any of these conditions: Heart disease Kidney disease Liver disease Substance use disorder Suicidal thoughts, plans, or attempt An unusual or allergic reaction to lacosamide, other medications, foods, dyes, or preservatives Pregnant or trying to get pregnant Breast-feeding How should I use this medication? Take this medication by mouth with water. Take it as directed on the prescription label at the same time every day. Do not cut, crush or chew this medication. Swallow the tablets whole. You can take it with or without food. If it upsets your stomach, take it with food. Keep taking it unless your care team tells you to stop. A special MedGuide will be given to you by the pharmacist with each prescription and refill. Be sure to read this information carefully each time. Talk to your care team about the use of this medication in children. While it may be prescribed for children as young as 1 month for selected conditions, precautions do apply. Overdosage: If you think you have taken too much of this medicine contact a poison control center or emergency room at once. NOTE: This medicine is only for you. Do not share this medicine with others. What if I miss a dose? If you miss a dose, take it as soon as you can. If it is almost time for your next dose, take only that dose. Do not take double or extra doses. What may interact with this medication? This medication may interact with the following: Atazanavir Beta blockers like metoprolol and propranolol Calcium channel blockers like  diltiazem and verapamil Certain medications for irregular heart beat like amiodarone, bepridil, dofetilide, encainide, flecainide, propafenone, quinidine Certain medications for seizures like carbamazepine, phenytoin Digoxin Dronedarone Lopinavir/ritonavir This list may not describe all possible interactions. Give your health care provider a list of all the medicines, herbs, non-prescription drugs, or dietary supplements you use. Also tell them if you smoke, drink alcohol, or use illegal drugs. Some items may interact with your medicine. What should I watch for while using this medication? Visit your care team for regular checks on your progress. Tell your care team if your symptoms do not start to get better or if they get worse. This medication may cause serious skin reactions. They can happen weeks to months after starting the medication. Contact your care team right away if you notice fevers or flu-like symptoms with a rash. The rash may be red or purple and then turn into blisters or peeling of the skin. Or, you might notice a red rash with swelling of the face, lips or lymph nodes in your neck or under your arms. Wear a medical ID bracelet or chain. Carry a card that describes your condition. List the medications and doses you take on the card. You may get drowsy or dizzy. Do not drive, use machinery, or do anything that needs mental alertness until you know how this medication affects you. Do not stand or sit up quickly, especially if you are an older patient. This reduces the risk of dizzy or fainting spells. Alcohol may interfere with the effect of this medication. Avoid alcoholic drinks. If you  or your family notice any changes in your behavior, such as new or worsening depression, thoughts of harming yourself, anxiety, other unusual or disturbing thoughts, or memory loss, call your care team right away. Patients who become pregnant while using this medication may enroll in the [place] Pregnancy Registry by calling [id number]. This registry collects information about the safety of antiepileptic medication use during pregnancy. What side effects may I notice from receiving this medication? Side effects that you should report to your care team as soon as possible: Allergic reactions--skin rash, itching, hives, swelling of the face, lips, tongue, or throat Heart rhythm changes--fast or irregular heartbeat, dizziness, feeling faint or lightheaded, chest pain, trouble breathing Rash, fever, and swollen lymph nodes Thoughts of suicide or self-harm, worsening mood, or feelings of depression Side effects that usually do not require medical attention (report to your care team if they continue or are bothersome): Dizziness Double vision Drowsiness Headache Loss of balance or coordination Nausea This list may not describe all possible side effects. Call your doctor for medical advice about side effects. You may report side effects to FDA at 1-800-FDA-1088. Where should I keep my medication? Keep out of the reach of children and pets. This medication can be abused. Keep it in a safe place to protect it from theft. Do not share it with anyone. It is only for you. Selling or giving away this medication is dangerous and against the law. Store at room temperature between 20 and 25 degrees C (68 and 77 degrees F). Get rid of any unused medication after the expiration date. This medication may cause harm and death if it is taken by other adults, children, or pets. It is important to get rid of the medication as soon as you no longer need it or it is expired. You can do this in two ways: Take the medication to a medication take-back program. Check with your pharmacy or law enforcement to find a location. If you cannot return the medication, check the label or package insert to see if the medication should be thrown out in the garbage or flushed down the toilet. If you are  not sure, ask your care team. If it is safe to put it in the trash, empty the medication out of the container. Mix the medication with cat litter, dirt, coffee grounds, or other unwanted substance. Seal the mixture in a bag or container. Put it in the trash. NOTE: This sheet is a summary. It may not cover all possible information. If you have questions about this medicine, talk to your doctor, pharmacist, or health care provider.  2023 [employer]/Gold Standard (2007-03-24 00:00:00)

## 2021-10-19 NOTE — Progress Notes (Signed)
PATIENT: Jordan Thomas DOB: February 10, 1946  REASON FOR VISIT: follow up HISTORY FROM: patient  HISTORY OF PRESENT ILLNESS:    Interval history: Pt is here  with sister, rm 9. Here from group home for follow up visit.  Rv on 10-19-2021.  I have the pleasure of seeing Jordan Thomas today and meanwhile 76 year old patient with a history of seizure disorder focal onset and accompanied by his sister for a more complete history.  The patient had ankle surgery July last year after refracturing an ankle.  He was seen at [facility].  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  S/p brain surgery.  His most successful treatment has been Vimpat which was still initiated under Dr. Tressia Danas care.  The patient is also on Depakote both medications require a every 6 months hepatic function test.  Last EEG was normal for awake and drowsy state with a with a background rhythm of the with a background rhythm of 9 Hz.  Refilling Depakote and Vimpat.         Pt is reporting that he is having "seizures" at night. States that it was either last night or the 11th that he had last one. (Or felt like it was last one).  Sister not sure these truly are seizures. Patient here  for breakthrough seizures many of the not witnessed and therefore it is not clear how long he may lose awareness, and if he suffered a seizure at all.  He feels that they are occurring every night. He felt this was a concern at his last visit too. Last EEG was completed 03/26/20 by Dr Krista Blue, none since. Read as normal.  He reports biting his lip and tongue in his sleep- frequently. His Depakote levels were just checked- and were therapeutic , reportedly normal. Sister is calling the faclity from here to get information about depakote ( VPA) levels.  He has elevated liver transaminases. Going on for 12 months.      12-23-2020: visit with Jordan Givens, NP. She ordered labs. The patient is  followed by hematology.  Dr Bobby Rumpf, follows for macrocytic anemia. He has ankle surgery in July 2022.   10-09-2019;  I have the pleasure of meeting Jordan Thomas today who is a 76 year old Caucasian gentleman living in an extended care facility here in [place].  He has a longstanding history of a seizure disorder and is usually accompanied by a family member.  Overall he had no new seizure-like activity out of the ordinary, but he was just 2 month ago diagnosed with diabetes. He remains on Vimpat 200 mg tablets, he is on valproic acid 250 mg 2 tablets by mouth 2 times a day for seizures.  Metformin, omeprazole, multivitamin, levothyroxine sodium 100 mcg, alendronate, aspirin 81 mg chewable a day, his glucose was monitored twice daily at the facility, he uses melatonin to sleep, he has Lumigan eyedrops, Linzess capsules for irritable bowel, desmopressin for nocturia, finasteride for prostate hyperplasia,  Claritin as needed for sinus and allergies, atorvastatin for hypercholesterolemia and he can use Zofran for nausea and Tylenol for pain or fever.  So at this time I do not see any medication aside of valproate that would make him gain weight or increase his blood sugar level.  I like for him to stay on depakote , but change it to ER  1000 mg at dinner time-       Video: 12-102020 CD-[name] is a 65-year  old resident at an extended care facility here in [place], [place].  With a longstanding history of a seizure disorder which has been well controlled on Vimpat and Depakote, he was independent in most activities of daily living but he did develop memory loss and does not drive.  He had some orthopedic procedures within the last 12 months but his facility went into Covid related lockdown and he has not had physical therapy since March.    01-21-2019 :He reports that he suffered a seizure about a month ago either October or November while he was alone in his room.   He also states he suffered a tongue bite but no incontinence.  He did not alert any members of the staff.  He denies skipping any medications and his caretakers are delivering the medication on time and usually watched watch the patient taking it.  Prior to that seizure that he subjectively reported the last seizure was in October 2019.   Curious about the coincidence that the seizures happened in October I asked if he had suffered any upper respiratory infection, inflammation, febrile illness, any medication changes, sleep deprivation or if he was taking decongestants.  He denied all of these possibilities and his caretaker also did.    Observations/Objective: Patient for severe breakthrough seizures many of the not witnessed and therefore it is not clear how long he may lose awareness, and if he suffered a seizure at all.  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  This condition is treated with topical creams.  I confirm that he is currently on alendronate-Fosamax, artificial tears, Lumigan eyedrops, calcium, Lotrimin cream, Depakote 250 mg 2 in the morning 2 at night, Proscar 5 mg daily Vimpat 200 mg by mouth 2 times daily and he is taking levothyroxine 100 mcg daily before breakfast, Linzess, Imodium as needed, Claritin as needed, Ativan 1 mg as needed for seizures.  Nystatin, omeprazole, Zofran as needed, Cialis as needed, tamsulosin as needed, Lipitor 10 mg every second day.  He  The patient has a history of aplastic anemia, epilepsy with secondary generalization, without status epilepticus.  Progressive memory loss, right hip pain, essential tremor medication related.  He denies losing taste or smell.  He was seated throughout the whole observation on video.  Last labs in my records were from 2019.    Interval history from 72 November 2018.  I have the pleasure of meeting today with Jordan Thomas and his sister. The patient has done  exceptionally well on Vimpat and Depakote, is independent in ADLs, his last seizure spell was in December one year ago.  He did have Some complaint of vertigo over the last year which resolved with vestibular rehab.  He continues to take his medication regularly, residing at [facility].  He gets assistance with his oral medication.    Jordan Thomas is a 76 year old male with a history of seizures. He returns today for follow-up. He continues on Depakote 250 mg 1 tablet in the morning and 2 tablets at bedtime. He also is on Vimpat. He reports that he has not had any recent seizures. He reports that he had a seizure approximately one month after his last visit with Dr. Brett Fairy. However he does not remember if there was anything that triggered the event. He states that he never misses his medication. He continues to live at [facility]. He is able to complete all ADLs independently. He does not operate  a motor vehicle. He reports episodes of dizziness that occurs approximately 3 times a day. It is not associated with positional changes. He states that the dizziness makes him feel as if he will have a seizure but the seizure never comes. He states that this is the same sensation he has gotten in the past prior to a seizure event. His primary care did a carotid Doppler that showed no hemodynamically significant ICA stenosis. He is also been referred to ENT for an evaluation. He returns today for an evaluation.   HISTORY 06/17/15: Jordan Thomas is a 76 year old male with seizures. He returns today for follow-up. He continues to take Depakote and Vimpat. He is tolerating these medications well. He states that he has not had a seizure since Halloween. He continues to live at [facility]. He is able to complete all ADLs independently. He does not operate a motor vehicle. Denies any changes with her gait or balance. He denies any new neurological symptoms. He returns today for an evaluation.   HISTORY (Estera Ozier): Jordan Thomas  is a 76 year old  caucasian male with a history of seizures.  He returns today for follow-up. He is currently taking Depakote and Vimpat. Patient states that he had two seizures in December. He states that he has grand mal seizures. He denies missing any medication. He does state that his mom passed in December and he is not sure if the stress from that contributed to his seizures. He also has a roommate that keeps him up at night. He states that his roommate gets up and walks around during the night. He feels that he does not sleep well due to this. He goes to bed around midnight and wakes at 6:00 AM.    He currently lives at [facility] and reports that he has requested a new room mate but that so far as not happened.   Since [name] lives with a roommate at his facility he is dependent on the sleep cycles of others to yesterday for example he could only go to bed at about 4 AM and then slept until 7. He is here today for routine visit and refills. He had no seizures since the last 6 years. I have recommended to discontinue or wean off Wellbutrin as it lowered the seizure threshold. He had also used Chantix for smoking cessation. Family had 2 seizures in December , and his dacility still hesitated  discontinuing that medication. His gait has not further changed. He feels stable. He is treated for epilepsy he is treated for glaucoma with Lumigan eyedrops, he has a history of falls his last fall was 18 months ago after his hip replacement. He is a much more stable gait since then. Patient has had 2 seizures in December. I had  increased Vimpat to 200 mg BID. I will check blood work. This will be completed at his facility since he has already taken his medication this morning. I have requested that his facility offer him a new room/ roommate so the patient can get better sleep.    REVIEW OF SYSTEMS: Out of a complete 14 system review of symptoms, the patient complains only of the following symptoms, and  all other reviewed systems are negative.  Patient for severe breakthrough seizures many of the not witnessed and therefore it is not clear how long he may lose awareness, and if he suffered a seizure at all.   ALLERGIES: Allergies  Allergen Reactions   Bupropion     Other  reaction(s): Other (See Comments)   Carbamazepine     Other reaction(s): Other (See Comments)   Codeine     Other reaction(s): Confusion (intolerance)   Donepezil Hcl     Other reaction(s): Other (See Comments)   Hydrocodone    Ibuprofen Diarrhea   Mephenytoin     Other reaction(s): Other (See Comments)   Topiramate     Other reaction(s): Other (See Comments)   Zonisamide     Other reaction(s): Other (See Comments)    HOME MEDICATIONS: Outpatient Medications Prior to Visit  Medication Sig Dispense Refill   acetaminophen (TYLENOL) 500 MG tablet Take 1,000 mg by mouth every 6 (six) hours as needed.     alendronate (FOSAMAX) 70 MG tablet Take 70 mg by mouth once a week.      AMOXICILLIN PO Take 1 tablet by mouth 3 times/day as needed-between meals & bedtime (Take 1 hour prior to dental appointment).     Artificial Tear Ointment (ARTIFICIAL TEARS) ointment Place into both eyes 3 (three) times daily.     aspirin 81 MG tablet Take 81 mg by mouth daily.     atorvastatin (LIPITOR) 20 MG tablet Take 20 mg by mouth daily.     bimatoprost (LUMIGAN) 0.01 % SOLN Place 1 drop into both eyes at bedtime.     calcipotriene (DOVONOX) 0.005 % cream Apply 1 application topically 2 (two) times daily.     Calcium Carb-Cholecalciferol (OYSTER SHELL CALCIUM + D PO) Take 1 tablet by mouth daily.     divalproex (DEPAKOTE ER) 500 MG 24 hr tablet Take 2 tablets (1,000 mg total) by mouth daily. 60 tablet 5   Emollient (MINERIN) LOTN Apply 1 Application topically 2 (two) times daily as needed.     EPINEPHrine 0.3 mg/0.3 mL IJ SOAJ injection Inject 0.3 mg into the muscle as needed.     fenofibrate (TRICOR) 145 MG tablet Take 145 mg by  mouth daily.     ferrous sulfate 325 (65 FE) MG tablet Take 325 mg by mouth 2 (two) times daily with a meal.     finasteride (PROSCAR) 5 MG tablet Take 5 mg by mouth daily.      furosemide (LASIX) 40 MG tablet Take 40 mg by mouth daily.     halobetasol (ULTRAVATE) 0.05 % cream Apply 1 application topically 2 (two) times daily.     ketoconazole (NIZORAL) 2 % shampoo Apply 1 application topically 2 (two) times a week. Monday and Friday     lacosamide (VIMPAT) 200 MG TABS tablet Take 1 tablet (200 mg total) by mouth 2 (two) times daily. 180 tablet 3   levothyroxine (SYNTHROID) 112 MCG tablet Take 112 mcg by mouth daily.     linaclotide (LINZESS) 290 MCG CAPS capsule Take 290 mcg by mouth. Every 48 hours.     loratadine (CLARITIN) 10 MG tablet Take 10 mg by mouth 2 (two) times daily.     melatonin 3 MG TABS tablet Take 3 mg by mouth at bedtime.     metFORMIN (GLUCOPHAGE) 500 MG tablet Take 500 mg by mouth daily with breakfast.     metoprolol tartrate (LOPRESSOR) 25 MG tablet Take 12.5 mg by mouth 2 (two) times daily.     mirabegron ER (MYRBETRIQ) 50 MG TB24 tablet Take 50 mg by mouth daily.     Multiple Vitamin (MULTIVITAMIN) tablet Take 1 tablet by mouth daily.     nystatin cream (MYCOSTATIN) Apply 1 application topically daily.      omeprazole (  PRILOSEC) 20 MG capsule      ondansetron (ZOFRAN) 4 MG tablet Take 4 mg by mouth every 6 (six) hours as needed for nausea or vomiting.     oxymetazoline (AFRIN) 0.05 % nasal spray Place 2 sprays into both nostrils 2 (two) times daily.     sodium fluoride (PREVIDENT 5000 PLUS) 1.1 % CREA dental cream Place 1 application onto teeth every evening.     tamsulosin (FLOMAX) 0.4 MG CAPS capsule Take 0.4 mg by mouth daily.     tiZANidine (ZANAFLEX) 2 MG tablet Take 2 mg by mouth 2 (two) times daily.     triamcinolone cream (KENALOG) 0.1 % Apply 1 application topically daily.     levothyroxine (SYNTHROID, LEVOTHROID) 100 MCG tablet 100 mcg daily before breakfast.       No facility-administered medications prior to visit.    PAST MEDICAL HISTORY: Past Medical History:  Diagnosis Date   Aplastic anemia ([place])    Epilepsy ([place])    Epilepsy without status epilepticus, not intractable ([place]) 06/04/2012   Memory loss 06/04/2012   Right hip pain 06/04/2012   Tremor     PAST SURGICAL HISTORY: Past Surgical History:  Procedure Laterality Date   CATARACT EXTRACTION     CRANIOTOMY Right    frontal    FAMILY HISTORY: Family History  Problem Relation Age of Onset   Parkinson's disease Mother        versus lewy body dementia   Diabetes Other    Seizures Other        grand mal   Stroke Maternal Grandmother    Heart disease Maternal Grandmother    Stroke Maternal Grandfather    Heart disease Maternal Grandfather    Stroke Paternal Grandmother    Heart disease Paternal Grandmother    Stroke Paternal Grandfather    Heart disease Paternal Grandfather    Mental retardation Cousin     SOCIAL HISTORY: Social History   Socioeconomic History   Marital status: Single    Spouse name: Not on file   Number of children: 0   Years of education: college   Highest education level: Not on file  Occupational History   Occupation: Doctor, general practice    Comment: resides in [place] Use   Smoking status: Former    Packs/day: 0.50    Years: 50.00    Total pack years: 25.00    Types: Cigarettes   Smokeless tobacco: Former    Quit date: 06/19/2012  Substance and Sexual Activity   Alcohol use: No    Alcohol/week: 0.0 standard drinks of alcohol   Drug use: No   Sexual activity: Not Currently  Other Topics Concern   Not on file  Social History Narrative   Patient is single and lives at [facility] [place].    Patient does not have any children.   Patient is retired.   Patient has some college education.   Patient is right-handed.   Patient drinks four cups of coffee daily, one cup of soda daily.   Social Determinants of Health    Financial Resource Strain: Not on file  Food Insecurity: Not on file  Transportation Needs: Not on file  Physical Activity: Not on file  Stress: Not on file  Social Connections: Not on file  Intimate Partner Violence: Not on file      PHYSICAL EXAM  Vitals:   10/19/21 1323  BP: 107/70  Pulse: 88  Weight: 210 lb (95.3 kg)  Height: 5'  6" (1.676 m)   Body mass index is 33.89 kg/m.  Generalized: Well developed, in no acute distress   Neurological examination  Mentation: Alert oriented to time, place, history taking. Follows all commands speech and language fluent Cranial nerve : very near sided - right Pupil larger than left, but both reactive to light and accomodation. Right ptosis.  Long standing. Right forehead has no wrinkles, left does. Bells palsy?   Extraocular movements were full,  visual field were full on confrontational test.  Facial strength is normal. Uvula and tongue remain midline.  Head turning and shoulder shrug  were normal and symmetric. Motor:  reveals full strength of all extremities, symmetric motor tone is noted throughout.  intact finger-nose bilaterally.  Gait and station:  stooped.   Tandem gait is normal. Romberg is negative ! No drift is seen.  Reflexes: Deep tendon reflexes are symmetric bilaterally, 2 plus upper DTRS.   DIAGNOSTIC DATA (LABS, IMAGING, TESTING) - I reviewed patient records, labs, notes, testing and imaging myself where available.  I am awaiting a phone call with results of recent VPA levels.         Component Value Date/Time   NA 142 07/05/2021 0000   K 4.1 07/05/2021 0000   CL 101 07/05/2021 0000   CO2 30 (A) 07/05/2021 0000   GLUCOSE 105 (H) 03/29/2021 1120   BUN 21 07/05/2021 0000   CREATININE 0.8 07/05/2021 0000   CREATININE 0.61 (L) 03/29/2021 1120   CALCIUM 9.2 07/05/2021 0000   PROT 7.0 03/29/2021 1120   ALBUMIN 4.4 07/05/2021 0000   ALBUMIN 4.0 03/29/2021 1120   AST 59 (A) 07/05/2021 0000   ALT 53 (A)  07/05/2021 0000   ALKPHOS 47 07/05/2021 0000   BILITOT <0.2 03/29/2021 1120   GFRNONAA 86 03/25/2020 1155   GFRAA 100 03/25/2020 1155      ASSESSMENT AND PLAN 76 y.o. year old male  has a past medical history of Aplastic anemia ([place]), Epilepsy ([place]), Epilepsy without status epilepticus, not intractable ([place]) (06/04/2012), Memory loss (06/04/2012), Right hip pain (06/04/2012), and Tremor. here with follow up for controlled Epilepsy,  he had received a diagnosis of DM , diet controlled and metformin was started.    1. Seizures, continued following a brain surgery to treat epilepsy ( 2009) [facility]-  Dr Gloriann Loan ) .Remains on Vimpat and Depakote and a former patient of Dr Erling Cruz.   2. The patient had a witnessed seizure event in December 2017 and none since then- he has been having episodes of dizziness ( vertigo )  which he reports have precipitated seizures in the past. He did well with vestibular therapy.  He had a fractured ankle in 2022.   3. Subjective memory loss- patient did an abbreviated memory test here today-  MMSE 25/ 30 points, no previous tests to compare or establish a trend.  Fully oriented to place and ate, drew a clock, did well with serial 7th. Please repeat next year again. Sister has noted a difference since May 2023.   I will check blood work today again.  I will keep Depakote VPA- at 1000 mg  at night , XR form  He will continue on Vimpat at same  dose- he feels that Vimpat made the difference.If the dizziness does return he should let us know or make his primary care provider aware.   He will follow-up in 12 months with NP .   I like for him to stay on depakote , but change it  to ER 1000 mg at dinner time-  I spent 25 minutes with the patient 50% of this time was spent discussing his possible seizure events. He can follow up every 12 months alternating with visits at the 6 months mark with PCP-  for liver metabolism check.    Larey Seat, MD  10/19/2021, 1:49  PM [facility] [address], [place], [place] [address] [phone]

## 2021-10-20 ENCOUNTER — Other Ambulatory Visit: Payer: Self-pay | Admitting: Neurology

## 2021-10-20 DIAGNOSIS — G40209 Localization-related (focal) (partial) symptomatic epilepsy and epileptic syndromes with complex partial seizures, not intractable, without status epilepticus: Secondary | ICD-10-CM

## 2021-10-20 DIAGNOSIS — Z79899 Other long term (current) drug therapy: Secondary | ICD-10-CM

## 2021-10-20 NOTE — Progress Notes (Signed)
Chronically elevated liver enzymes, he is also at high risk for osteoporosis.   Drug levels were in therapeutic range.   Bone density test recommended

## 2021-10-21 ENCOUNTER — Telehealth: Payer: Self-pay | Admitting: Neurology

## 2021-10-21 NOTE — Telephone Encounter (Signed)
Called the sister and reviewed the lab results with her. Advised that the liver enzymes are still elevated and she states that they are continuing to monitor that. She will make sure he has had a updated bone scan completed. He is taking fosamax. She was appreciative for the call.

## 2021-10-21 NOTE — Telephone Encounter (Signed)
-----   Message from Larey Seat, MD sent at 10/20/2021  9:19 AM EDT ----- Chronically elevated liver enzymes, he is also at high risk for osteoporosis.   Drug levels were in therapeutic range.   Bone density test recommended

## 2021-10-22 LAB — COMPREHENSIVE METABOLIC PANEL
ALT: 30 IU/L (ref 0–44)
AST: 32 IU/L (ref 0–40)
Albumin/Globulin Ratio: 1.3 (ref 1.2–2.2)
Albumin: 4.3 g/dL (ref 3.8–4.8)
Alkaline Phosphatase: 46 IU/L (ref 44–121)
BUN/Creatinine Ratio: 15 (ref 10–24)
BUN: 16 mg/dL (ref 8–27)
Bilirubin Total: 0.2 mg/dL (ref 0.0–1.2)
CO2: 25 mmol/L (ref 20–29)
Calcium: 10 mg/dL (ref 8.6–10.2)
Chloride: 99 mmol/L (ref 96–106)
Creatinine, Ser: 1.06 mg/dL (ref 0.76–1.27)
Globulin, Total: 3.3 g/dL (ref 1.5–4.5)
Glucose: 103 mg/dL — ABNORMAL HIGH (ref 70–99)
Potassium: 4.8 mmol/L (ref 3.5–5.2)
Sodium: 140 mmol/L (ref 134–144)
Total Protein: 7.6 g/dL (ref 6.0–8.5)
eGFR: 74 mL/min/{1.73_m2} (ref >59)

## 2021-10-22 LAB — LACOSAMIDE: Lacosamide: 11.2 ug/mL — ABNORMAL HIGH (ref 5.0–10.0)

## 2021-10-22 LAB — VALPROIC ACID LEVEL: Valproic Acid Lvl: 38 ug/mL — ABNORMAL LOW (ref 50–100)

## 2021-10-24 NOTE — Progress Notes (Signed)
Happy B day ! Liver enzymes unchanged ( abnormal ) - we are weaning you off Depakote

## 2021-10-26 ENCOUNTER — Encounter: Payer: Self-pay | Admitting: Neurology

## 2022-01-09 NOTE — Progress Notes (Unsigned)
[facility]  [address] [place],  [place]  [address] [phone]  Clinic Day: 07/05/2021  Referring physician: Bonnita Nasuti, MD  HISTORY OF PRESENT ILLNESS:  The patient is a 76 y.o. male who I recently began seeing for anemia.  He comes in today to reassess his hemoglobin.  Since his last visit, the patient has been doing well.  He denies having increased fatigue or any overt forms of blood loss which concern him for progressive anemia.    PHYSICAL EXAM:  There were no vitals taken for this visit. Wt Readings from Last 3 Encounters:  10/19/21 210 lb (95.3 kg)  07/05/21 208 lb 11.2 oz (94.7 kg)  03/29/21 209 lb (94.8 kg)   There is no height or weight on file to calculate BMI. Performance status (ECOG): 1 - Symptomatic but completely ambulatory Physical Exam Constitutional:      Appearance: Normal appearance. He is not ill-appearing.     Comments: Poor memory recollection  HENT:     Mouth/Throat:     Mouth: Mucous membranes are moist.     Pharynx: Oropharynx is clear. No oropharyngeal exudate or posterior oropharyngeal erythema.  Cardiovascular:     Rate and Rhythm: Normal rate and regular rhythm.     Heart sounds: No murmur heard.    No friction rub. No gallop.  Pulmonary:     Effort: Pulmonary effort is normal. No respiratory distress.     Breath sounds: Normal breath sounds. No wheezing, rhonchi or rales.  Abdominal:     General: Bowel sounds are normal. There is no distension.     Palpations: Abdomen is soft. There is no mass.     Tenderness: There is no abdominal tenderness.  Musculoskeletal:        General: No swelling.     Right lower leg: No edema.     Left lower leg: No edema.  Lymphadenopathy:     Cervical: No cervical adenopathy.     Upper Body:     Right upper body: No supraclavicular or axillary adenopathy.     Left upper body: No supraclavicular or axillary adenopathy.     Lower Body: No right inguinal adenopathy.  No left inguinal adenopathy.  Skin:    General: Skin is warm.     Coloration: Skin is not jaundiced.     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 No lesion.  Neurological:     General: No focal deficit present.     Mental Status: He is alert and oriented to person, place, and time. Mental status is at baseline.  Psychiatric:        Mood and Affect: Mood normal.        Behavior: Behavior normal.        Thought Content: Thought content normal.    LABS:      Latest Ref Rng & Units 07/05/2021   12:00 AM 01/04/2021   12:00 AM 09/02/2020   12:00 AM  CBC  WBC  5.1     6.3     4.7   Hemoglobin 13.5 - 17.5 11.8     11.2     11.9   Hematocrit 41 - 53 37     33     36   Platelets 150 - 400 K/uL 195     172     151      This result is from an external source.       Latest Ref  Rng & Units 10/19/2021    2:22 PM 07/05/2021   12:00 AM 03/29/2021   11:20 AM  CMP  Glucose 70 - 99 mg/dL 103   105   BUN 8 - 27 mg/dL '16  21     11   '$ Creatinine 0.76 - 1.27 mg/dL 1.06  0.8     0.61   Sodium 134 - 144 mmol/L 140  142     142   Potassium 3.5 - 5.2 mmol/L 4.8  4.1     4.6   Chloride 96 - 106 mmol/L 99  101     103   CO2 20 - 29 mmol/L '25  30     30   '$ Calcium 8.6 - 10.2 mg/dL 10.0  9.2     9.5   Total Protein 6.0 - 8.5 g/dL 7.6   7.0   Total Bilirubin 0.0 - 1.2 mg/dL 0.2   <0.2   Alkaline Phos 44 - 121 IU/L 46  47     82   AST 0 - 40 IU/L 32  59     57   ALT 0 - 44 IU/L 30  53     57      This result is from an external source.     Latest Reference Range & Units 07/05/21 13:11  Iron 45 - 182 ug/dL 94  UIBC ug/dL 350  TIBC 250 - 450 ug/dL 444  Saturation Ratios 17.9 - 39.5 % 21  Ferritin 24 - 336 ng/mL 324  Folate >5.9 ng/mL 14.0  Vitamin B12 180 - 914 pg/mL 271   ASSESSMENT & PLAN:  A 76 y.o. male with mild anemia.  Although his hemoglobin is still low, it is better than what it was at his last visit.  Furthermore, labs today did not show any evidence of nutritional  deficiency being present.  Clinically, this gentleman is doing well.  Based upon that, I will see him back in 6 months for repeat clinical assessment.  The patient understands all the plans discussed today and is in agreement with them.  Marijayne Rauth Macarthur Critchley, MD

## 2022-01-10 ENCOUNTER — Inpatient Hospital Stay: Payer: Medicare Other | Attending: Oncology | Admitting: Oncology

## 2022-01-10 ENCOUNTER — Other Ambulatory Visit: Payer: Self-pay | Admitting: Oncology

## 2022-01-10 ENCOUNTER — Inpatient Hospital Stay: Payer: Medicare Other

## 2022-01-10 ENCOUNTER — Telehealth: Payer: Self-pay

## 2022-01-10 VITALS — BP 150/72 | HR 97 | Temp 98.6°F | Resp 18 | Ht 66.0 in | Wt 216.5 lb

## 2022-01-10 DIAGNOSIS — D649 Anemia, unspecified: Secondary | ICD-10-CM

## 2022-01-10 DIAGNOSIS — D539 Nutritional anemia, unspecified: Secondary | ICD-10-CM

## 2022-01-10 LAB — IRON AND TIBC
Iron: 79 ug/dL (ref 45–182)
Saturation Ratios: 21 % (ref 17.9–39.5)
TIBC: 383 ug/dL (ref 250–450)
UIBC: 304 ug/dL

## 2022-01-10 LAB — CMP (CANCER CENTER ONLY)
ALT: 26 U/L (ref 0–44)
AST: 24 U/L (ref 15–41)
Albumin: 3.8 g/dL (ref 3.5–5.0)
Alkaline Phosphatase: 34 U/L — ABNORMAL LOW (ref 38–126)
Anion gap: 7 (ref 5–15)
BUN: 20 mg/dL (ref 8–23)
CO2: 28 mmol/L (ref 22–32)
Calcium: 8.6 mg/dL — ABNORMAL LOW (ref 8.9–10.3)
Chloride: 105 mmol/L (ref 98–111)
Creatinine: 1.15 mg/dL (ref 0.61–1.24)
GFR, Estimated: >60 mL/min (ref >60)
Glucose, Bld: 116 mg/dL — ABNORMAL HIGH (ref 70–99)
Potassium: 3.9 mmol/L (ref 3.5–5.1)
Sodium: 140 mmol/L (ref 135–145)
Total Bilirubin: 0.3 mg/dL (ref 0.3–1.2)
Total Protein: 7.4 g/dL (ref 6.5–8.1)

## 2022-01-10 LAB — FERRITIN: Ferritin: 301 ng/mL (ref 24–336)

## 2022-01-10 LAB — CBC AND DIFFERENTIAL
HCT: 32 — AB (ref 41–53)
Hemoglobin: 10.6 — AB (ref 13.5–17.5)
Neutrophils Absolute: 2.92
Platelets: 183 10*3/uL (ref 150–400)
WBC: 5.4

## 2022-01-10 LAB — CBC: RBC: 3.31 — AB (ref 3.87–5.11)

## 2022-01-10 NOTE — Telephone Encounter (Signed)
Jolayne Haines, employee from [facility] called to get iron results. Dr Bobby Rumpf left message for them to call after 4p. Iron labs have still not resulted @ 1700-awc.

## 2022-04-25 ENCOUNTER — Telehealth: Payer: Self-pay | Admitting: Adult Health

## 2022-04-28 ENCOUNTER — Ambulatory Visit (INDEPENDENT_AMBULATORY_CARE_PROVIDER_SITE_OTHER): Payer: Medicare Other | Admitting: Adult Health

## 2022-04-28 ENCOUNTER — Encounter: Payer: Self-pay | Admitting: Adult Health

## 2022-04-28 VITALS — BP 138/70 | HR 91 | Ht 66.0 in | Wt 215.0 lb

## 2022-04-28 DIAGNOSIS — G40209 Localization-related (focal) (partial) symptomatic epilepsy and epileptic syndromes with complex partial seizures, not intractable, without status epilepticus: Secondary | ICD-10-CM | POA: Diagnosis not present

## 2022-04-28 DIAGNOSIS — Z79899 Other long term (current) drug therapy: Secondary | ICD-10-CM | POA: Diagnosis not present

## 2022-04-28 MED ORDER — DIVALPROEX SODIUM ER 500 MG PO TB24: 1000.0000 mg | ORAL_TABLET | Freq: Every day | ORAL | 5 refills | Status: DC

## 2022-04-28 MED ORDER — LACOSAMIDE 200 MG PO TABS: ORAL_TABLET | ORAL | 5 refills | Status: DC

## 2022-04-28 NOTE — Progress Notes (Signed)
PATIENT: Jordan Thomas DOB: 01-Mar-1945  REASON FOR VISIT: follow up HISTORY FROM: patient  Chief Complaint  Patient presents with   Follow-up    Pt in 4 with sister Pt here due to seizure last week  sister states patient didn't get his seizure medicine two nights in a row at his ALF. Pt has two chipped teeth ,bruise on elbow and bruised  knee  Pt states no ED visit   Sister states pt fell on his face Pt states dentist  states Neurology has to clear patient for him see     HISTORY OF PRESENT ILLNESS: Today 04/28/22:  Jordan Thomas is a 77 y.o. male with a history of Seizures. Returns today for follow-up.  He remains on Depakote extended release 500 mg 2 tablets daily and Vimpat 200 mg twice a day. Continues to live at a group home.  Had a seizure event last week. Was not witnessed but patient went and told caregivers. Reports that he knocked two teeth out. [place] on his face. He is not sure how long he was out.  Group home did not send him to the ED.  Sister reports that the nurse at the group home reports that they missed giving him his vimpat for two nights in a row.  Patient states that he has not had a seizure event in 4 years until last week.    03/25/20: [name] is a 77 year old male with a history of seizures.  He returns today for follow-up.  He is currently on Depakote 2 tablets daily and Vimpat 200 mg twice a day.  He states that he has been waking up with sore lips each night for the past 2 weeks.  He feels that he may be having nocturnal seizure events.  There is been no witnessed events.  He lives in a group home.  Denies missing any medication.  He returns today for an evaluation.  HISTORY (Copied from Dr.Dohmeier's note) 10-09-2019;  I have the pleasure of meeting Jordan Thomas today who is a 77 year old Caucasian gentleman living in an extended care facility here in [place].  He has a longstanding history of a seizure disorder and is usually  accompanied by a family member.  Overall he had no new seizure-like activity out of the ordinary, but he was just 2 month ago diagnosed with diabetes. He remains on Vimpat 200 mg tablets, he is on valproic acid 250 mg 2 tablets by mouth 2 times a day for seizures.  Metformin, omeprazole, multivitamin, levothyroxine sodium 100 mcg, alendronate, aspirin 81 mg chewable a day, his glucose was monitored twice daily at the facility, he uses melatonin to sleep, he has Lumigan eyedrops, Linzess capsules for irritable bowel, desmopressin for nocturia, finasteride for prostate hyperplasia,  Claritin as needed for sinus and allergies, atorvastatin for hypercholesterolemia and he can use Zofran for nausea and Tylenol for pain or fever.  So at this time I do not see any medication aside of valproate that would make him gain weight or increase his blood sugar level.    REVIEW OF SYSTEMS: Out of a complete 14 system review of symptoms, the patient complains only of the following symptoms, and all other reviewed systems are negative.  ALLERGIES: Allergies  Allergen Reactions   Bupropion     Other reaction(s): Other (See Comments)   Carbamazepine     Other reaction(s): Other (See Comments)   Codeine     Other reaction(s): Confusion (intolerance)  Donepezil Hcl     Other reaction(s): Other (See Comments)   Hydrocodone    Ibuprofen Diarrhea   Mephenytoin     Other reaction(s): Other (See Comments)   Topiramate     Other reaction(s): Other (See Comments)   Zonisamide     Other reaction(s): Other (See Comments)    HOME MEDICATIONS: Outpatient Medications Prior to Visit  Medication Sig Dispense Refill   acetaminophen (TYLENOL) 500 MG tablet Take 1,000 mg by mouth every 6 (six) hours as needed.     alendronate (FOSAMAX) 70 MG tablet Take 70 mg by mouth once a week.      AMOXICILLIN PO Take 1 tablet by mouth 3 times/day as needed-between meals & bedtime (Take 1 hour prior to dental appointment).      Artificial Tear Ointment (ARTIFICIAL TEARS) ointment Place into both eyes 3 (three) times daily.     aspirin 81 MG tablet Take 81 mg by mouth daily.     atorvastatin (LIPITOR) 20 MG tablet Take 20 mg by mouth daily.     bimatoprost (LUMIGAN) 0.01 % SOLN Place 1 drop into both eyes at bedtime.     calcipotriene (DOVONOX) 0.005 % cream Apply 1 application topically 2 (two) times daily.     Calcium Carb-Cholecalciferol (OYSTER SHELL CALCIUM + D PO) Take 1 tablet by mouth daily.     divalproex (DEPAKOTE ER) 500 MG 24 hr tablet Take 2 tablets (1,000 mg total) by mouth daily. 60 tablet 5   Emollient (MINERIN) LOTN Apply 1 Application topically 2 (two) times daily as needed.     EPINEPHrine 0.3 mg/0.3 mL IJ SOAJ injection Inject 0.3 mg into the muscle as needed.     fenofibrate (TRICOR) 145 MG tablet Take 145 mg by mouth daily.     ferrous sulfate 325 (65 FE) MG tablet Take 325 mg by mouth 2 (two) times daily with a meal.     finasteride (PROSCAR) 5 MG tablet Take 5 mg by mouth daily.      furosemide (LASIX) 40 MG tablet Take 40 mg by mouth daily.     halobetasol (ULTRAVATE) 0.05 % cream Apply 1 application topically 2 (two) times daily.     ketoconazole (NIZORAL) 2 % shampoo Apply 1 application topically 2 (two) times a week. Monday and Friday     lacosamide (VIMPAT) 200 MG TABS tablet TAKE 1 TAB TABLET BY MOUTH TWICE A DAY 60 tablet 5   levothyroxine (SYNTHROID) 112 MCG tablet Take 112 mcg by mouth daily.     linaclotide (LINZESS) 290 MCG CAPS capsule Take 290 mcg by mouth. Every 48 hours.     loratadine (CLARITIN) 10 MG tablet Take 10 mg by mouth 2 (two) times daily.     melatonin 3 MG TABS tablet Take 3 mg by mouth at bedtime.     metFORMIN (GLUCOPHAGE) 500 MG tablet Take 500 mg by mouth daily with breakfast.     metoprolol tartrate (LOPRESSOR) 25 MG tablet Take 12.5 mg by mouth 2 (two) times daily.     mirabegron ER (MYRBETRIQ) 50 MG TB24 tablet Take 50 mg by mouth daily.     Multiple Vitamin  (MULTIVITAMIN) tablet Take 1 tablet by mouth daily.     nystatin cream (MYCOSTATIN) Apply 1 application topically daily.      omeprazole (PRILOSEC) 20 MG capsule      ondansetron (ZOFRAN) 4 MG tablet Take 4 mg by mouth every 6 (six) hours as needed for nausea or vomiting.  oxymetazoline (AFRIN) 0.05 % nasal spray Place 2 sprays into both nostrils 2 (two) times daily.     sodium fluoride (PREVIDENT 5000 PLUS) 1.1 % CREA dental cream Place 1 application onto teeth every evening.     tamsulosin (FLOMAX) 0.4 MG CAPS capsule Take 0.4 mg by mouth daily.     tiZANidine (ZANAFLEX) 2 MG tablet Take 2 mg by mouth 2 (two) times daily.     triamcinolone cream (KENALOG) 0.1 % Apply 1 application topically daily.     No facility-administered medications prior to visit.    PAST MEDICAL HISTORY: Past Medical History:  Diagnosis Date   Aplastic anemia ([place])    Epilepsy (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   Memory loss 06/04/2012   Right hip pain 06/04/2012   Tremor     PAST SURGICAL HISTORY: Past Surgical History:  Procedure Laterality Date   CATARACT EXTRACTION     CRANIOTOMY Right    frontal    FAMILY HISTORY: Family History  Problem Relation Age of Onset   Parkinson's disease Mother        versus lewy body dementia   Diabetes Other    Seizures Other        grand mal   Stroke Maternal Grandmother    Heart disease Maternal Grandmother    Stroke Maternal Grandfather    Heart disease Maternal Grandfather    Stroke Paternal Grandmother    Heart disease Paternal Grandmother    Stroke Paternal Grandfather    Heart disease Paternal Grandfather    Mental retardation Cousin     SOCIAL HISTORY: Social History   Socioeconomic History   Marital status: Single    Spouse name: Not on file   Number of children: 0   Years of education: college   Highest education level: Not on file  Occupational History   Occupation: Doctor, general practice    Comment: resides  in [place] Use   Smoking status: Former    Packs/day: 0.50    Years: 50.00    Additional pack years: 0.00    Total pack years: 25.00    Types: Cigarettes   Smokeless tobacco: Former    Quit date: 06/19/2012  Substance and Sexual Activity   Alcohol use: No    Alcohol/week: 0.0 standard drinks of alcohol   Drug use: No   Sexual activity: Not Currently  Other Topics Concern   Not on file  Social History Narrative   Patient is single and lives at [facility].    Patient does not have any children.   Patient is retired.   Patient has some college education.   Patient is right-handed.   Patient drinks four cups of coffee daily, one cup of soda daily.   Social Determinants of Health   Financial Resource Strain: Not on file  Food Insecurity: Not on file  Transportation Needs: Not on file  Physical Activity: Not on file  Stress: Not on file  Social Connections: Not on file  Intimate Partner Violence: Not on file      PHYSICAL EXAM  Vitals:   04/28/22 1339  BP: 138/70  Pulse: 91  Weight: 215 lb (97.5 kg)  Height: '5\' 6"'$  (1.676 m)    Body mass index is 34.7 kg/m.  Generalized: Well developed, in no acute distress, teeth in the front are chipped  Neurological examination  Mentation: Alert oriented to time, place, history taking. Follows all commands speech and  language fluent Cranial nerve II-XII: Pupils were equal round reactive to light. Extraocular movements were full, visual field were full on confrontational test.  Head turning and shoulder shrug  were normal and symmetric.  Motor: The motor testing reveals 5 over 5 strength of all 4 extremities. Good symmetric motor tone is noted throughout.  Sensory: Sensory testing is intact to soft touch on all 4 extremities. No evidence of extinction is noted.  Coordination: Cerebellar testing reveals good finger-nose-finger and heel-to-shin bilaterally.  Gait and station: Gait is normal.     DIAGNOSTIC DATA (LABS, IMAGING, TESTING) - I reviewed patient records, labs, notes, testing and imaging myself where available.  Lab Results  Component Value Date   WBC 5.4 01/10/2022   HGB 10.6 (A) 01/10/2022   HCT 32 (A) 01/10/2022   MCV 90 03/25/2020   PLT 183 01/10/2022      Component Value Date/Time   NA 140 01/10/2022 1328   NA 140 10/19/2021 1422   K 3.9 01/10/2022 1328   CL 105 01/10/2022 1328   CO2 28 01/10/2022 1328   GLUCOSE 116 (H) 01/10/2022 1328   BUN 20 01/10/2022 1328   BUN 16 10/19/2021 1422   CREATININE 1.15 01/10/2022 1328   CALCIUM 8.6 (L) 01/10/2022 1328   PROT 7.4 01/10/2022 1328   PROT 7.6 10/19/2021 1422   ALBUMIN 3.8 01/10/2022 1328   ALBUMIN 4.3 10/19/2021 1422   AST 24 01/10/2022 1328   ALT 26 01/10/2022 1328   ALKPHOS 34 (L) 01/10/2022 1328   BILITOT 0.3 01/10/2022 1328   GFRNONAA >60 01/10/2022 1328   GFRAA 100 03/25/2020 1155      ASSESSMENT AND PLAN 77 y.o. 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 here with:  1.  Seizures  --Continue Depakote ER at 1000 mg daily-refilled today --Continue Vimpat 200 mg twice a day-refilled today --Sent a note to the group home to advise that the patient should not miss any medications.   --Advised if he has any seizure events he should let us know  Follow-up in 6 months or sooner if needed     Ward Givens, MSN, NP-C 04/28/2022, 1:19 PM [facility] [address], [place], [place] [address] [phone]

## 2022-04-28 NOTE — Patient Instructions (Addendum)
Continue Vimpat 200 mg twice a day Continue Depakote ER 1000 mg daily   Please ensure the patient does NOT miss any doses of his seizure medications. Please call in advance and we can ensure refills stay on file.   Thanks!  Ward Givens NP-C

## 2022-05-11 NOTE — Progress Notes (Deleted)
[facility]  [address] [place],  [place]  [address] [phone]  Clinic Day: 01/10/2022  Referring physician: Bonnita Nasuti, MD  HISTORY OF PRESENT ILLNESS:  The patient is a 77 y.o. male who I recently began seeing for anemia.  He comes in today to reassess his hemoglobin.  Since his last visit, the patient has been doing well.  He denies having increased fatigue or any overt forms of blood loss which concern him for progressive anemia.    PHYSICAL EXAM:  There were no vitals taken for this visit. Wt Readings from Last 3 Encounters:  04/28/22 215 lb (97.5 kg)  01/10/22 216 lb 8 oz (98.2 kg)  10/19/21 210 lb (95.3 kg)   There is no height or weight on file to calculate BMI. Performance status (ECOG): 1 - Symptomatic but completely ambulatory Physical Exam Constitutional:      Appearance: Normal appearance. He is not ill-appearing.     Comments: Poor memory recollection  HENT:     Mouth/Throat:     Mouth: Mucous membranes are moist.     Pharynx: Oropharynx is clear. No oropharyngeal exudate or posterior oropharyngeal erythema.  Cardiovascular:     Rate and Rhythm: Normal rate and regular rhythm.     Heart sounds: No murmur heard.    No friction rub. No gallop.  Pulmonary:     Effort: Pulmonary effort is normal. No respiratory distress.     Breath sounds: Normal breath sounds. No wheezing, rhonchi or rales.  Abdominal:     General: Bowel sounds are normal. There is no distension.     Palpations: Abdomen is soft. There is no mass.     Tenderness: There is no abdominal tenderness.  Musculoskeletal:        General: No swelling.     Right lower leg: No edema.     Left lower leg: No edema.  Lymphadenopathy:     Cervical: No cervical adenopathy.     Upper Body:     Right upper body: No supraclavicular or axillary adenopathy.     Left upper body: No supraclavicular or axillary adenopathy.     Lower Body: No right inguinal adenopathy. No  left inguinal adenopathy.  Skin:    General: Skin is warm.     Coloration: Skin is not jaundiced.     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 No lesion.  Neurological:     General: No focal deficit present.     Mental Status: He is alert and oriented to person, place, and time. Mental status is at baseline.  Psychiatric:        Mood and Affect: Mood normal.        Behavior: Behavior normal.        Thought Content: Thought content normal.     LABS:    Latest Reference Range & Units 01/10/22 13:28  Iron 45 - 182 ug/dL 79  UIBC ug/dL 304  TIBC 250 - 450 ug/dL 383  Saturation Ratios 17.9 - 39.5 % 21  Ferritin 24 - 336 ng/mL 301   ASSESSMENT & PLAN:  A 77 y.o. male with mild anemia. His hemoglobin has fallen since his last visit.  However, his iron parameters are normal.  As his hemoglobin remains above 10, it will continue to be followed conservatively.  I will see him back in 4 months for repeat clinical assessment.  If his hemoglobin falls below 10, a bone marrow biopsy would be considered  for further evaluation.  The patient understands all the plans discussed today and is in agreement with them.  Lewayne Pauley Macarthur Critchley, MD

## 2022-05-12 ENCOUNTER — Inpatient Hospital Stay: Payer: Medicare Other | Admitting: Oncology

## 2022-05-12 ENCOUNTER — Inpatient Hospital Stay: Payer: Medicare Other

## 2022-05-19 ENCOUNTER — Other Ambulatory Visit: Payer: Medicare Other

## 2022-05-19 ENCOUNTER — Ambulatory Visit: Payer: Medicare Other | Admitting: Oncology

## 2022-05-23 ENCOUNTER — Ambulatory Visit: Payer: Medicare Other | Admitting: Oncology

## 2022-05-23 ENCOUNTER — Other Ambulatory Visit: Payer: Medicare Other

## 2022-06-14 NOTE — Progress Notes (Signed)
  [facility]  [address] [place],  [place]  [address] [phone]  Clinic Day: 06/15/2022  Referring physician: Galvin Proffer, MD  HISTORY OF PRESENT ILLNESS:  The patient is a 77 y.o. male who I recently began seeing for anemia.  He comes in today to reassess his hemoglobin.  Since his last visit, the patient has been doing well.  He denies having increased fatigue or any overt forms of blood loss which concern him for progressive anemia.    PHYSICAL EXAM:  Blood pressure (!) 145/67, pulse 92, temperature 98.6 F (37 C), temperature source Oral, resp. rate 18, weight 208 lb (94.3 kg), SpO2 94 %. Wt Readings from Last 3 Encounters:  06/15/22 208 lb (94.3 kg)  04/28/22 215 lb (97.5 kg)  01/10/22 216 lb 8 oz (98.2 kg)   Body mass index is 33.57 kg/m. Performance status (ECOG): 1 - Symptomatic but completely ambulatory Physical Exam Constitutional:      Appearance: Normal appearance. He is not ill-appearing.     Comments: Poor memory recollection  HENT:     Mouth/Throat:     Mouth: Mucous membranes are moist.     Pharynx: Oropharynx is clear. No oropharyngeal exudate or posterior oropharyngeal erythema.  Cardiovascular:     Rate and Rhythm: Normal rate and regular rhythm.     Heart sounds: No murmur heard.    No friction rub. No gallop.  Pulmonary:     Effort: Pulmonary effort is normal. No respiratory distress.     Breath sounds: Normal breath sounds. No wheezing, rhonchi or rales.  Abdominal:     General: Bowel sounds are normal. There is no distension.     Palpations: Abdomen is soft. There is no mass.     Tenderness: There is no abdominal tenderness.  Musculoskeletal:        General: No swelling.     Right lower leg: No edema.     Left lower leg: No edema.  Lymphadenopathy:     Cervical: No cervical adenopathy.     Upper Body:     Right upper body: No supraclavicular or axillary adenopathy.     Left upper body: No supraclavicular  or axillary adenopathy.     Lower Body: No right inguinal adenopathy. No left inguinal adenopathy.  Skin:    General: Skin is warm.     Coloration: Skin is not jaundiced.     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 No lesion.  Neurological:     General: No focal deficit present.     Mental Status: He is alert and oriented to person, place, and time. Mental status is at baseline.  Psychiatric:        Mood and Affect: Mood normal.        Behavior: Behavior normal.        Thought Content: Thought content normal.     LABS:    ASSESSMENT & PLAN:  A 77 y.o. male with mild anemia.  When evaluating his labs today, I am pleased that his hemoglobin has improved since his last visit.  As long as his hemoglobin remains well above 10, his anemia will continue to be followed conservatively.  I will see him back in 6 months for repeat clinical assessment.  The patient understands all the plans discussed today and is in agreement with them.  Anabia Weatherwax Kirby Funk, MD

## 2022-06-15 ENCOUNTER — Inpatient Hospital Stay: Payer: Medicare Other | Attending: Hematology & Oncology

## 2022-06-15 ENCOUNTER — Inpatient Hospital Stay (INDEPENDENT_AMBULATORY_CARE_PROVIDER_SITE_OTHER): Payer: Medicare Other | Admitting: Oncology

## 2022-06-15 VITALS — BP 145/67 | HR 92 | Temp 98.6°F | Resp 18 | Wt 208.0 lb

## 2022-06-15 DIAGNOSIS — D649 Anemia, unspecified: Secondary | ICD-10-CM | POA: Diagnosis not present

## 2022-06-15 DIAGNOSIS — D539 Nutritional anemia, unspecified: Secondary | ICD-10-CM

## 2022-06-15 LAB — CBC AND DIFFERENTIAL
HCT: 34 — AB (ref 41–53)
Hemoglobin: 11.2 — AB (ref 13.5–17.5)
Neutrophils Absolute: 3.6
Platelets: 188 10*3/uL (ref 150–400)
WBC: 5.9

## 2022-06-15 LAB — CBC: RBC: 3.41 — AB (ref 3.87–5.11)

## 2022-10-20 ENCOUNTER — Encounter: Payer: Self-pay | Admitting: Adult Health

## 2022-10-20 ENCOUNTER — Ambulatory Visit (INDEPENDENT_AMBULATORY_CARE_PROVIDER_SITE_OTHER): Payer: Medicare Other | Admitting: Adult Health

## 2022-10-20 VITALS — BP 130/73 | HR 99 | Ht 66.0 in | Wt 211.8 lb

## 2022-10-20 DIAGNOSIS — R413 Other amnesia: Secondary | ICD-10-CM

## 2022-10-20 DIAGNOSIS — G3184 Mild cognitive impairment, so stated: Secondary | ICD-10-CM

## 2022-10-20 DIAGNOSIS — Z79899 Other long term (current) drug therapy: Secondary | ICD-10-CM | POA: Diagnosis not present

## 2022-10-20 DIAGNOSIS — G40209 Localization-related (focal) (partial) symptomatic epilepsy and epileptic syndromes with complex partial seizures, not intractable, without status epilepticus: Secondary | ICD-10-CM

## 2022-10-20 MED ORDER — DIVALPROEX SODIUM ER 500 MG PO TB24: 1000.0000 mg | ORAL_TABLET | Freq: Every day | ORAL | 5 refills | Status: DC

## 2022-10-20 MED ORDER — LACOSAMIDE 200 MG PO TABS: ORAL_TABLET | ORAL | 5 refills | Status: DC

## 2022-10-20 NOTE — Progress Notes (Addendum)
PATIENT: Jordan Thomas DOB: 1945/04/08  REASON FOR VISIT: follow up HISTORY FROM: patient  Chief Complaint  Patient presents with   Follow-up    Pt in 18  with sister Pt here for seizure f/u  Pt states seizure  like activity this week     HISTORY OF PRESENT ILLNESS: Today 10/20/22:  Jordan Thomas is a 77 y.o. male with a history of seizures. Returns today for follow-up.  He remains on Depakote extended release 500 mg 2 tablets daily and Vimpat 200 mg twice a day.  Resides in a group home. Reports that he had one episode this week- describes it as crossing his arms and shaking. He was completely aware, no LOC, did not bite tongue or cheek, no urination.   Reports some memory disturbance.  States that he Has a disc player and forgets how to turn it on or off. Able to complete all ADLS independently. Sister manages his finances. Sleep patterns vary. May stay up late watching TV.  Returns today for an evaluation      04/28/22: [name] is a 77 y.o. male with a history of Seizures. Returns today for follow-up.  He remains on Depakote extended release 500 mg 2 tablets daily and Vimpat 200 mg twice a day. Continues to live at a group home.  Had a seizure event last week. Was not witnessed but patient went and told caregivers. Reports that he knocked two teeth out. Larey Seat on his face. He is not sure how long he was out.  Group home did not send him to the ED.  Sister reports that the nurse at the group home reports that they missed giving him his vimpat for two nights in a row.  Patient states that he has not had a seizure event in 4 years until last week.    03/25/20: Jordan Thomas is a 77 year old male with a history of seizures.  He returns today for follow-up.  He is currently on Depakote 2 tablets daily and Vimpat 200 mg twice a day.  He states that he has been waking up with sore lips each night for the past 2 weeks.  He feels that he may be having nocturnal seizure  events.  There is been no witnessed events.  He lives in a group home.  Denies missing any medication.  He returns today for an evaluation.  HISTORY (Copied from Dr.Dohmeier's note) 10-09-2019;  I have the pleasure of meeting Jordan Thomas today who is a 77 year old Caucasian gentleman living in an extended care facility here in [facility].  He has a longstanding history of a seizure disorder and is usually accompanied by a family member.  Overall he had no new seizure-like activity out of the ordinary, but he was just 2 month ago diagnosed with diabetes. He remains on Vimpat 200 mg tablets, he is on valproic acid 250 mg 2 tablets by mouth 2 times a day for seizures.  Metformin, omeprazole, multivitamin, levothyroxine sodium 100 mcg, alendronate, aspirin 81 mg chewable a day, his glucose was monitored twice daily at the facility, he uses melatonin to sleep, he has Lumigan eyedrops, Linzess capsules for irritable bowel, desmopressin for nocturia, finasteride for prostate hyperplasia,  Claritin as needed for sinus and allergies, atorvastatin for hypercholesterolemia and he can use Zofran for nausea and Tylenol for pain or fever.  So at this time I do not see any medication aside of valproate that would make him gain weight or  increase his blood sugar level.    REVIEW OF SYSTEMS: Out of a complete 14 system review of symptoms, the patient complains only of the following symptoms, and all other reviewed systems are negative.  ALLERGIES: Allergies  Allergen Reactions   Bupropion     Other reaction(s): Other (See Comments)   Carbamazepine     Other reaction(s): Other (See Comments)   Codeine     Other reaction(s): Confusion (intolerance)   Donepezil Hcl     Other reaction(s): Other (See Comments)   Hydrocodone    Ibuprofen Diarrhea   Mephenytoin     Other reaction(s): Other (See Comments)   Topiramate     Other reaction(s): Other (See Comments)   Zonisamide     Other  reaction(s): Other (See Comments)    HOME MEDICATIONS: Outpatient Medications Prior to Visit  Medication Sig Dispense Refill   acetaminophen (TYLENOL) 500 MG tablet Take 1,000 mg by mouth every 6 (six) hours as needed.     alendronate (FOSAMAX) 70 MG tablet Take 70 mg by mouth once a week.      Aloe Vera 500 MG CAPS Take by mouth.     ammonium lactate (LAC-HYDRIN) 12 % lotion Apply 1 Application topically as needed for dry skin.     Artificial Tear Ointment (ARTIFICIAL TEARS) ointment Place into both eyes 3 (three) times daily.     aspirin 81 MG tablet Take 81 mg by mouth daily.     atorvastatin (LIPITOR) 20 MG tablet Take 20 mg by mouth daily.     bimatoprost (LUMIGAN) 0.01 % SOLN Place 1 drop into both eyes at bedtime.     bimatoprost (LUMIGAN) 0.01 % SOLN 1 drop at bedtime.     calcipotriene (DOVONOX) 0.005 % cream Apply 1 application topically 2 (two) times daily.     Calcium Carb-Cholecalciferol (OYSTER SHELL CALCIUM + D PO) Take 1 tablet by mouth daily.     clobetasol cream (TEMOVATE) 0.05 % Apply 1 Application topically 2 (two) times daily.     divalproex (DEPAKOTE ER) 500 MG 24 hr tablet Take 2 tablets (1,000 mg total) by mouth daily. 60 tablet 5   Emollient (MINERIN) LOTN Apply 1 Application topically 2 (two) times daily as needed.     Emollient (MINERIN) LOTN Apply topically.     EPINEPHrine 0.3 mg/0.3 mL IJ SOAJ injection Inject 0.3 mg into the muscle as needed.     fenofibrate (TRICOR) 145 MG tablet Take 145 mg by mouth daily.     ferrous sulfate 325 (65 FE) MG tablet Take 325 mg by mouth 2 (two) times daily with a meal.     finasteride (PROSCAR) 5 MG tablet Take 5 mg by mouth daily.      furosemide (LASIX) 40 MG tablet Take 40 mg by mouth daily.     halobetasol (ULTRAVATE) 0.05 % cream Apply 1 application topically 2 (two) times daily.     halobetasol (ULTRAVATE) 0.05 % cream Apply topically 2 (two) times daily.     hydroxypropyl methylcellulose / hypromellose (ISOPTO TEARS /  GONIOVISC) 2.5 % ophthalmic solution 1 drop.     ketoconazole (NIZORAL) 2 % shampoo Apply 1 application topically 2 (two) times a week. Monday and Friday     lacosamide (VIMPAT) 200 MG TABS tablet TAKE 1 TAB TABLET BY MOUTH TWICE A DAY 60 tablet 5   levothyroxine (SYNTHROID) 112 MCG tablet Take 112 mcg by mouth daily.     linaclotide (LINZESS) 290 MCG CAPS capsule Take 290  mcg by mouth. Every 48 hours.     loratadine (CLARITIN) 10 MG tablet Take 10 mg by mouth 2 (two) times daily.     melatonin 3 MG TABS tablet Take 3 mg by mouth at bedtime.     metFORMIN (GLUCOPHAGE) 500 MG tablet Take 500 mg by mouth daily with breakfast.     metoprolol tartrate (LOPRESSOR) 25 MG tablet Take 12.5 mg by mouth 2 (two) times daily.     mirabegron ER (MYRBETRIQ) 50 MG TB24 tablet Take 50 mg by mouth daily.     Multiple Vitamin (MULTIVITAMIN) tablet Take 1 tablet by mouth daily.     nystatin cream (MYCOSTATIN) Apply 1 application topically daily.      omeprazole (PRILOSEC) 20 MG capsule      ondansetron (ZOFRAN) 4 MG tablet Take 4 mg by mouth every 6 (six) hours as needed for nausea or vomiting.     oxymetazoline (AFRIN) 0.05 % nasal spray Place 2 sprays into both nostrils 2 (two) times daily.     selenium sulfide (SELSUN) 1 % LOTN Apply 1 Application topically daily.     sodium fluoride (PREVIDENT 5000 PLUS) 1.1 % CREA dental cream Place 1 application onto teeth every evening.     tamsulosin (FLOMAX) 0.4 MG CAPS capsule Take 0.4 mg by mouth daily.     tiZANidine (ZANAFLEX) 2 MG tablet Take 2 mg by mouth 2 (two) times daily.     triamcinolone cream (KENALOG) 0.1 % Apply 1 application topically daily.     AMOXICILLIN PO Take 1 tablet by mouth 3 times/day as needed-between meals & bedtime (Take 1 hour prior to dental appointment). (Patient not taking: Reported on 10/20/2022)     No facility-administered medications prior to visit.    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   Memory loss 06/04/2012   Right hip pain 06/04/2012   Tremor     PAST SURGICAL HISTORY: Past Surgical History:  Procedure Laterality Date   CATARACT EXTRACTION     CRANIOTOMY Right    frontal    FAMILY HISTORY: Family History  Problem Relation Age of Onset   Parkinson's disease Mother        versus lewy body dementia   Diabetes Other    Seizures Other        grand mal   Stroke Maternal Grandmother    Heart disease Maternal Grandmother    Stroke Maternal Grandfather    Heart disease Maternal Grandfather    Stroke Paternal Grandmother    Heart disease Paternal Grandmother    Stroke Paternal Grandfather    Heart disease Paternal Grandfather    Mental retardation Cousin     SOCIAL HISTORY: Social History   Socioeconomic History   Marital status: Single    Spouse name: Not on file   Number of children: 0   Years of education: college   Highest education level: Not on file  Occupational History   Occupation: Airline pilot    Comment: resides in [place]  Tobacco Use   Smoking status: Former    Current packs/day: 0.50    Average packs/day: 0.5 packs/day for 50.0 years (25.0 ttl pk-yrs)    Types: Cigarettes   Smokeless tobacco: Former    Quit date: 06/19/2012  Substance and Sexual Activity   Alcohol use: No    Alcohol/week: 0.0 standard drinks of  alcohol   Drug use: No   Sexual activity: Not Currently  Other Topics Concern   Not on file  Social History Narrative   Patient is single and lives at Assisted [facility] [place].    Patient does not have any children.   Patient is retired.   Patient has some college education.   Patient is right-handed.   Patient drinks four cups of coffee daily, one cup of soda daily.   Social Determinants of Health   Financial Resource Strain: Not on file  Food Insecurity: Not on file  Transportation Needs: Not on file  Physical  Activity: Not on file  Stress: Not on file  Social Connections: Not on file  Intimate Partner Violence: Not on file      PHYSICAL EXAM  Vitals:   10/20/22 1312  BP: 130/73  Pulse: 99  Weight: 211 lb 12.8 oz (96.1 kg)  Height: 5\' 6"  (1.676 m)    Body mass index is 34.19 kg/m.     10/20/2022    2:03 PM 10/22/2013    2:30 PM  MMSE - Mini Mental State Exam  Orientation to time 4 5  Orientation to Place 5 5  Registration 3 3  Attention/ Calculation 3 5  Recall 3 3  Language- name 2 objects 2 2  Language- repeat 1 1  Language- follow 3 step command 3 3  Language- read & follow direction 1 1  Write a sentence 1 1  Copy design 0 0  Total score 26 29     Generalized: Well developed, in no acute distress, teeth in the front are chipped  Neurological examination  Mentation: Alert oriented to time, place, history taking. Follows all commands speech and language fluent Cranial nerve II-XII: Pupils were equal round reactive to light. Extraocular movements were full, visual field were full on confrontational test.  Head turning and shoulder shrug  were normal and symmetric.  Motor: The motor testing reveals 5 over 5 strength of all 4 extremities. Good symmetric motor tone is noted throughout.  Sensory: Sensory testing is intact to soft touch on all 4 extremities. No evidence of extinction is noted.  Coordination: Cerebellar testing reveals good finger-nose-finger and heel-to-shin bilaterally.  Gait and station: Gait is normal.    DIAGNOSTIC DATA (LABS, IMAGING, TESTING) - I reviewed patient records, labs, notes, testing and imaging myself where available.  Lab Results  Component Value Date   WBC 5.9 06/15/2022   HGB 11.2 (A) 06/15/2022   HCT 34 (A) 06/15/2022   MCV 90 03/25/2020   PLT 188 06/15/2022      Component Value Date/Time   NA 140 01/10/2022 1328   NA 140 10/19/2021 1422   K 3.9 01/10/2022 1328   CL 105 01/10/2022 1328   CO2 28 01/10/2022 1328   GLUCOSE 116  (H) 01/10/2022 1328   BUN 20 01/10/2022 1328   BUN 16 10/19/2021 1422   CREATININE 1.15 01/10/2022 1328   CALCIUM 8.6 (L) 01/10/2022 1328   PROT 7.4 01/10/2022 1328   PROT 7.6 10/19/2021 1422   ALBUMIN 3.8 01/10/2022 1328   ALBUMIN 4.3 10/19/2021 1422   AST 24 01/10/2022 1328   ALT 26 01/10/2022 1328   ALKPHOS 34 (L) 01/10/2022 1328   BILITOT 0.3 01/10/2022 1328   GFRNONAA >60 01/10/2022 1328   GFRAA 100 03/25/2020 1155      ASSESSMENT AND PLAN 77 y.o. year old male  has a past medical history of Aplastic anemia (HCC), Epilepsy (HCC),  Epilepsy without status epilepticus, not intractable (HCC) (06/04/2012), Memory loss (06/04/2012), Right hip pain (06/04/2012), and Tremor. here with:  1.  Seizures  --Continue Depakote ER at 1000 mg daily-refilled today --Continue Vimpat 200 mg twice a day-refilled today --Sent a note to the group home to advise that the patient should not miss any medications.   --Advised if he has any seizure events he should let us know  Follow-up in 6 months or sooner if needed     Butch Penny, MSN, NP-C 10/20/2022, 1:36 PM [facility] [address], [address] [place], [place] [address] [phone]

## 2022-10-20 NOTE — Patient Instructions (Signed)
Your Plan:  Continue Depakote ER 1000 mg daily  Continue Vimpat 200 mg BID      Thank you for coming to see [place] at [facility]. I hope we have been able to provide you high quality care today.  You may receive a patient satisfaction survey over the next few weeks. We would appreciate your feedback and comments so that we may continue to improve ourselves and the health of our patients.

## 2022-10-22 ENCOUNTER — Encounter: Payer: Self-pay | Admitting: Adult Health

## 2022-10-22 DIAGNOSIS — R413 Other amnesia: Secondary | ICD-10-CM

## 2022-10-25 ENCOUNTER — Telehealth: Payer: Self-pay | Admitting: *Deleted

## 2022-10-25 LAB — COMPREHENSIVE METABOLIC PANEL
ALT: 20 IU/L (ref 0–44)
AST: 21 IU/L (ref 0–40)
Albumin: 4.1 g/dL (ref 3.8–4.8)
Alkaline Phosphatase: 42 IU/L — ABNORMAL LOW (ref 44–121)
BUN/Creatinine Ratio: 17 (ref 10–24)
BUN: 16 mg/dL (ref 8–27)
Bilirubin Total: <0.2 mg/dL (ref 0.0–1.2)
CO2: 25 mmol/L (ref 20–29)
Calcium: 9.5 mg/dL (ref 8.6–10.2)
Chloride: 100 mmol/L (ref 96–106)
Creatinine, Ser: 0.96 mg/dL (ref 0.76–1.27)
Globulin, Total: 2.8 g/dL (ref 1.5–4.5)
Glucose: 129 mg/dL — ABNORMAL HIGH (ref 70–99)
Potassium: 4.5 mmol/L (ref 3.5–5.2)
Sodium: 139 mmol/L (ref 134–144)
Total Protein: 6.9 g/dL (ref 6.0–8.5)
eGFR: 83 mL/min/{1.73_m2} (ref >59)

## 2022-10-25 LAB — CBC WITH DIFFERENTIAL/PLATELET
Basophils Absolute: 0 10*3/uL (ref 0.0–0.2)
Basos: 1 %
EOS (ABSOLUTE): 0 10*3/uL (ref 0.0–0.4)
Eos: 1 %
Hematocrit: 32.8 % — ABNORMAL LOW (ref 37.5–51.0)
Hemoglobin: 10.6 g/dL — ABNORMAL LOW (ref 13.0–17.7)
Immature Grans (Abs): 0 10*3/uL (ref 0.0–0.1)
Immature Granulocytes: 1 %
Lymphocytes Absolute: 1.4 10*3/uL (ref 0.7–3.1)
Lymphs: 40 %
MCH: 32.1 pg (ref 26.6–33.0)
MCHC: 32.3 g/dL (ref 31.5–35.7)
MCV: 99 fL — ABNORMAL HIGH (ref 79–97)
Monocytes Absolute: 0.4 10*3/uL (ref 0.1–0.9)
Monocytes: 10 %
Neutrophils Absolute: 1.7 10*3/uL (ref 1.4–7.0)
Neutrophils: 47 %
Platelets: 174 10*3/uL (ref 150–450)
RBC: 3.3 x10E6/uL — ABNORMAL LOW (ref 4.14–5.80)
RDW: 13.8 % (ref 11.6–15.4)
WBC: 3.5 10*3/uL (ref 3.4–10.8)

## 2022-10-25 LAB — VITAMIN B12: Vitamin B-12: 393 pg/mL (ref 232–1245)

## 2022-10-25 LAB — VALPROIC ACID LEVEL: Valproic Acid Lvl: 21 ug/mL — ABNORMAL LOW (ref 50–100)

## 2022-10-25 LAB — TSH: TSH: 2.75 u[IU]/mL (ref 0.450–4.500)

## 2022-10-25 LAB — LACOSAMIDE: Lacosamide: 13.8 ug/mL — ABNORMAL HIGH (ref 5.0–10.0)

## 2022-10-25 NOTE — Telephone Encounter (Signed)
-----   Message from [facility] sent at 10/25/2022 10:49 AM EDT ----- RBC, hemoglobin hematocrit was slightly decreased.  Please make sure these results get to his PCP for review

## 2022-10-25 NOTE — Telephone Encounter (Signed)
Spoke to sister (checked DPR) gave labwork results Pt states will call faclity where pt is living and let them know  lab work results  forward results to PCP  Sister thanked me for calling

## 2022-11-02 ENCOUNTER — Telehealth: Payer: Self-pay | Admitting: Adult Health

## 2022-11-02 NOTE — Telephone Encounter (Signed)
medicare/medicaid NPR sent to GI [phone]

## 2022-11-08 ENCOUNTER — Encounter: Payer: Self-pay | Admitting: Adult Health

## 2022-12-14 ENCOUNTER — Ambulatory Visit
Admission: RE | Admit: 2022-12-14 | Discharge: 2022-12-14 | Disposition: A | Discharge status: Home / Self Care | Payer: Medicare Other | Source: Ambulatory Visit | Attending: Adult Health

## 2022-12-14 DIAGNOSIS — R413 Other amnesia: Secondary | ICD-10-CM

## 2022-12-14 MED ORDER — GADOPICLENOL 0.5 MMOL/ML IV SOLN
10.0000 mL | Freq: Once | INTRAVENOUS | Status: AC | PRN
  Administered 2022-12-14: 10 mL via INTRAVENOUS

## 2022-12-15 ENCOUNTER — Telehealth: Payer: Self-pay | Admitting: *Deleted

## 2022-12-15 NOTE — Telephone Encounter (Signed)
LVM  for sister to call back to discuss pt MRI results

## 2022-12-15 NOTE — Telephone Encounter (Signed)
Spoke to sister (checked DPR) Gave MRI results  sister pt has f/u 04/2023 with Dr Vickey Huger  Sister thanked me for calling

## 2022-12-15 NOTE — Telephone Encounter (Signed)
-----   Message from [facility] sent at 12/15/2022  3:06 PM EDT ----- Please call sister and let her know that there are no acute abnormalities on MRI scan.  It does show postop craniotomy changes and some age-related changes.

## 2022-12-18 NOTE — Progress Notes (Unsigned)
  [facility]  [address] [place],  [place]  [address] [phone]  Clinic Day: 06/15/2022  Referring physician: Galvin Proffer, MD  HISTORY OF PRESENT ILLNESS:  The patient is a 77 y.o. male who I recently began seeing for anemia.  He comes in today to reassess his hemoglobin.  Since his last visit, the patient has been doing well.  He denies having increased fatigue or any overt forms of blood loss which concern him for progressive anemia.    PHYSICAL EXAM:  There were no vitals taken for this visit. Wt Readings from Last 3 Encounters:  10/20/22 211 lb 12.8 oz (96.1 kg)  06/15/22 208 lb (94.3 kg)  04/28/22 215 lb (97.5 kg)   There is no height or weight on file to calculate BMI. Performance status (ECOG): 1 - Symptomatic but completely ambulatory Physical Exam Constitutional:      Appearance: Normal appearance. He is not ill-appearing.     Comments: Poor memory recollection  HENT:     Mouth/Throat:     Mouth: Mucous membranes are moist.     Pharynx: Oropharynx is clear. No oropharyngeal exudate or posterior oropharyngeal erythema.  Cardiovascular:     Rate and Rhythm: Normal rate and regular rhythm.     Heart sounds: No murmur heard.    No friction rub. No gallop.  Pulmonary:     Effort: Pulmonary effort is normal. No respiratory distress.     Breath sounds: Normal breath sounds. No wheezing, rhonchi or rales.  Abdominal:     General: Bowel sounds are normal. There is no distension.     Palpations: Abdomen is soft. There is no mass.     Tenderness: There is no abdominal tenderness.  Musculoskeletal:        General: No swelling.     Right lower leg: No edema.     Left lower leg: No edema.  Lymphadenopathy:     Cervical: No cervical adenopathy.     Upper Body:     Right upper body: No supraclavicular or axillary adenopathy.     Left upper body: No supraclavicular or axillary adenopathy.     Lower Body: No right inguinal adenopathy.  No left inguinal adenopathy.  Skin:    General: Skin is warm.     Coloration: Skin is not jaundiced.     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 No lesion.  Neurological:     General: No focal deficit present.     Mental Status: He is alert and oriented to person, place, and time. Mental status is at baseline.  Psychiatric:        Mood and Affect: Mood normal.        Behavior: Behavior normal.        Thought Content: Thought content normal.    LABS:    ASSESSMENT & PLAN:  A 77 y.o. male with mild anemia.  When evaluating his labs today, I am pleased that his hemoglobin has improved since his last visit.  As long as his hemoglobin remains well above 10, his anemia will continue to be followed conservatively.  I will see him back in 6 months for repeat clinical assessment.  The patient understands all the plans discussed today and is in agreement with them.  Jordan Nasser Kirby Funk, MD

## 2022-12-19 ENCOUNTER — Inpatient Hospital Stay: Payer: Medicare Other

## 2022-12-19 ENCOUNTER — Inpatient Hospital Stay: Payer: Medicare Other | Attending: Oncology | Admitting: Oncology

## 2022-12-19 ENCOUNTER — Telehealth: Payer: Self-pay | Admitting: Oncology

## 2022-12-19 ENCOUNTER — Other Ambulatory Visit: Payer: Self-pay | Admitting: Oncology

## 2022-12-19 VITALS — BP 132/59 | HR 85 | Temp 98.5°F | Resp 16 | Ht 66.0 in | Wt 209.3 lb

## 2022-12-19 DIAGNOSIS — Z79899 Other long term (current) drug therapy: Secondary | ICD-10-CM | POA: Insufficient documentation

## 2022-12-19 DIAGNOSIS — D649 Anemia, unspecified: Secondary | ICD-10-CM | POA: Diagnosis present

## 2022-12-19 LAB — CBC WITH DIFFERENTIAL (CANCER CENTER ONLY)
Abs Immature Granulocytes: 0.03 10*3/uL (ref 0.00–0.07)
Basophils Absolute: 0 10*3/uL (ref 0.0–0.1)
Basophils Relative: 1 %
Eosinophils Absolute: 0 10*3/uL (ref 0.0–0.5)
Eosinophils Relative: 1 %
HCT: 32.6 % — ABNORMAL LOW (ref 39.0–52.0)
Hemoglobin: 10.8 g/dL — ABNORMAL LOW (ref 13.0–17.0)
Immature Granulocytes: 1 %
Lymphocytes Relative: 32 %
Lymphs Abs: 1.3 10*3/uL (ref 0.7–4.0)
MCH: 33.3 pg (ref 26.0–34.0)
MCHC: 33.1 g/dL (ref 30.0–36.0)
MCV: 100.6 fL — ABNORMAL HIGH (ref 80.0–100.0)
Monocytes Absolute: 0.5 10*3/uL (ref 0.1–1.0)
Monocytes Relative: 13 %
Neutro Abs: 2.1 10*3/uL (ref 1.7–7.7)
Neutrophils Relative %: 52 %
Platelet Count: 166 10*3/uL (ref 150–400)
RBC: 3.24 MIL/uL — ABNORMAL LOW (ref 4.22–5.81)
RDW: 15.1 % (ref 11.5–15.5)
WBC Count: 4 10*3/uL (ref 4.0–10.5)
nRBC: 0 % (ref 0.0–0.2)
nRBC: 0 /100{WBCs}

## 2022-12-19 NOTE — Progress Notes (Signed)
[facility]  [address] [place],  [place]  [address] [phone]  Clinic Day:  12/19/2022  Referring physician: Galvin Proffer, MD   HISTORY OF PRESENT ILLNESS:  The patient is a 77 y.o. male with mild anemia.  As his hemoglobin has been above 10, his anemia has been followed conservatively.  He comes in today to reassess his hemoglobin.  Since his last visit, the patient has been doing well.  He denies having increased fatigue or any overt forms of blood loss which concern him for progressive anemia.    PHYSICAL EXAM:  There were no vitals taken for this visit. Wt Readings from Last 3 Encounters:  10/20/22 211 lb 12.8 oz (96.1 kg)  06/15/22 208 lb (94.3 kg)  04/28/22 215 lb (97.5 kg)   There is no height or weight on file to calculate BMI. Performance status (ECOG): 1 - Symptomatic but completely ambulatory Physical Exam Constitutional:      Appearance: Normal appearance. He is not ill-appearing.  HENT:     Mouth/Throat:     Mouth: Mucous membranes are moist.     Pharynx: Oropharynx is clear. No oropharyngeal exudate or posterior oropharyngeal erythema.  Cardiovascular:     Rate and Rhythm: Normal rate and regular rhythm.     Heart sounds: No murmur heard.    No friction rub. No gallop.  Pulmonary:     Effort: Pulmonary effort is normal. No respiratory distress.     Breath sounds: Normal breath sounds. No wheezing, rhonchi or rales.  Abdominal:     General: Bowel sounds are normal. There is no distension.     Palpations: Abdomen is soft. There is no mass.     Tenderness: There is no abdominal tenderness.  Musculoskeletal:        General: No swelling.     Right lower leg: No edema.     Left lower leg: No edema.  Lymphadenopathy:     Cervical: No cervical adenopathy.     Upper Body:     Right upper body: No supraclavicular or axillary adenopathy.     Left upper body: No supraclavicular or axillary adenopathy.     Lower Body: No  right inguinal adenopathy. No left inguinal adenopathy.  Skin:    General: Skin is warm.     Coloration: Skin is not jaundiced.     Findings: No lesion or rash.  Neurological:     General: No focal deficit present.     Mental Status: He is alert and oriented to person, place, and time. Mental status is at baseline.  Psychiatric:        Mood and Affect: Mood normal.        Behavior: Behavior normal.        Thought Content: Thought content normal.     LABS:      Latest Ref Rng & Units 12/19/2022   10:29 AM 10/20/2022    2:23 PM 06/15/2022   12:00 AM  CBC  WBC 4.0 - 10.5 K/uL 4.0  3.5  5.9      Hemoglobin 13.0 - 17.0 g/dL 91.4  78.2  95.6      Hematocrit 39.0 - 52.0 % 32.6  32.8  34      Platelets 150 - 400 K/uL 166  174  188         This result is from an external source.      Latest Ref Rng & Units 10/20/2022  2:23 PM 01/10/2022    1:28 PM 10/19/2021    2:22 PM  CMP  Glucose 70 - 99 mg/dL 027  253  664   BUN 8 - 27 mg/dL 16  20  16    Creatinine 0.76 - 1.27 mg/dL 4.03  4.74  2.59   Sodium 134 - 144 mmol/L 139  140  140   Potassium 3.5 - 5.2 mmol/L 4.5  3.9  4.8   Chloride 96 - 106 mmol/L 100  105  99   CO2 20 - 29 mmol/L 25  28  25    Calcium 8.6 - 10.2 mg/dL 9.5  8.6  56.3   Total Protein 6.0 - 8.5 g/dL 6.9  7.4  7.6   Total Bilirubin 0.0 - 1.2 mg/dL <8.7  0.3  0.2   Alkaline Phos 44 - 121 IU/L 42  34  46   AST 0 - 40 IU/L 21  24  32   ALT 0 - 44 IU/L 20  26  30     ASSESSMENT & PLAN:  Assessment/Plan:  A 77 y.o. male with mild anemia.  When evaluating his labs today, I am pleased that his hemoglobin remains adequate.  Of note, he chronically takes Depakote twice daily.  As long as his hemoglobin remains well above 10, his anemia will continue to be followed conservatively.  I will see him back in another 6 months for repeat clinical assessment.  The patient understands all the plans discussed today and is in agreement with them.     Felton Buczynski Kirby Funk, MD

## 2022-12-19 NOTE — Telephone Encounter (Signed)
Patient has been scheduled for follow-up visit per 12/19/22 LOS.  Pt given an appt calendar with date and time.

## 2022-12-28 ENCOUNTER — Encounter: Payer: Self-pay | Admitting: Adult Health

## 2023-04-25 ENCOUNTER — Ambulatory Visit (INDEPENDENT_AMBULATORY_CARE_PROVIDER_SITE_OTHER): Payer: Medicare Other | Admitting: Neurology

## 2023-04-25 ENCOUNTER — Encounter: Payer: Self-pay | Admitting: Neurology

## 2023-04-25 VITALS — BP 103/59 | HR 90 | Ht 66.0 in | Wt 203.5 lb

## 2023-04-25 DIAGNOSIS — R4701 Aphasia: Secondary | ICD-10-CM | POA: Diagnosis not present

## 2023-04-25 DIAGNOSIS — Z79899 Other long term (current) drug therapy: Secondary | ICD-10-CM | POA: Diagnosis not present

## 2023-04-25 DIAGNOSIS — G40209 Localization-related (focal) (partial) symptomatic epilepsy and epileptic syndromes with complex partial seizures, not intractable, without status epilepticus: Secondary | ICD-10-CM | POA: Diagnosis not present

## 2023-04-25 MED ORDER — DIVALPROEX SODIUM ER 500 MG PO TB24: 1000.0000 mg | ORAL_TABLET | Freq: Every day | ORAL | 5 refills | Status: DC

## 2023-04-25 MED ORDER — LACOSAMIDE 200 MG PO TABS: ORAL_TABLET | ORAL | 5 refills | Status: DC

## 2023-04-25 MED ORDER — MELATONIN 3 MG PO TABS: 3.0000 mg | ORAL_TABLET | Freq: Every day | ORAL | 1 refills | Status: DC

## 2023-04-25 NOTE — Progress Notes (Signed)
 Provider:  Melvyn Novas, MD  Primary Care Physician:  Galvin Proffer, MD [address] [place] [address]     Referring Provider: Galvin Proffer, Md [address] [place],  [place] [address]          Chief Complaint according to patient   Patient presents with:                HISTORY OF PRESENT ILLNESS:  Jordan Thomas is a 78 y.o. male patient who is here for revisit 04/25/2023 for  seizure control.  He has done well on VIMPAT Jordan lower doses of valproic acid, has less tremor since Vimpat has been the main cover for seizures.  Chief concern according to patient :  No recent seizure but  my last seizure -was last year- March 6 th 2024, after i was not given seizure meds for 2 days.   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  He report losing weight, but his weight is 200 pounds, he quit smoking  4 years ago. He reports confusional episodes. He is easily fatigued as the day progresses Jordan has word finding problems.  His sister confirms this observation.    Interval history: Pt is here  with sister, rm 11. Here from group home for follow up visit.  Rv on 10-19-2021.  I have the pleasure of seeing Jordan Thomas Jordan meanwhile 78 year old patient with a history of seizure disorder focal onset Jordan accompanied by his sister for a more complete history.  The patient had ankle surgery July last year after refracturing an ankle.  He was seen at [facility].  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  S/p brain surgery.  His most successful treatment has been Vimpat which was still initiated under Dr. Imagene Gurney care.  The patient is also on Depakote both medications require a every 6 months hepatic function test.  Last EEG was normal for awake Jordan drowsy state with a with a  background rhythm of the with a background rhythm of 9 Hz.   Refilling Depakote ( with intention to reduce the dose over time)  Jordan Vimpat.     10-09-2019;  I have the pleasure of meeting Jordan Thomas Thomas who is a 78 year old Caucasian gentleman living in an extended care facility here in [facility].  He has a longstanding history of a seizure disorder Jordan is usually accompanied by a family member.  Overall he had no new seizure-like activity out of the ordinary, but he was just 2 month ago diagnosed with diabetes. He remains on Vimpat 200 mg tablets, he is on valproic acid 250 mg 2 tablets by mouth 2 times a day for seizures.  Metformin, omeprazole, multivitamin, levothyroxine sodium 100 mcg, alendronate, aspirin 81 mg chewable a day, his glucose was monitored twice daily at the facility, he uses melatonin to sleep, he has Lumigan eyedrops, Linzess capsules for irritable bowel, desmopressin for nocturia, finasteride for prostate hyperplasia,  Claritin as needed for sinus Jordan allergies, atorvastatin for hypercholesterolemia Jordan he can use Zofran for nausea Jordan Tylenol for pain or fever.  So at this time I do not see any medication aside of valproate that would make him gain weight or increase his blood sugar level.  Video: [id number] Jordan Thomas is a 78 year old resident at an  extended care facility here in [place], [place]. With a longstanding history of a seizure disorder which has been well controlled on Vimpat Jordan Depakote, he was independent in most activities of daily living but he did develop memory loss Jordan does not drive. He had some orthopedic procedures within the last 12 months but his facility went into Covid related lockdown Jordan he has not had physical therapy since March.     Review of Systems: Out of a complete 14 system review, the patient complains of only the following symptoms, Jordan all other reviewed systems are negative.:   Tremor, daytime  fatigue, aphasia.   Gets up at 4 AM,  at 6 Am he gets ViMPAT Jordan all morning pills.  Night time meds are given at 6 Pm.    Social History   Socioeconomic History   Marital status: Single    Spouse name: Not on file   Number of children: 0   Years of education: college   Highest education level: Not on file  Occupational History   Occupation: Airline pilot    Comment: resides in [place]  Tobacco Use   Smoking status: Former    Current packs/day: 0.50    Average packs/day: 0.5 packs/day for 50.0 years (25.0 ttl pk-yrs)    Types: Cigarettes   Smokeless tobacco: Former    Quit date: 06/19/2012  Substance Jordan Sexual Activity   Alcohol use: No    Alcohol/week: 0.0 standard drinks of alcohol   Drug use: No   Sexual activity: Not Currently  Other Topics Concern   Not on file  Social History Narrative   Patient is single Jordan lives at [facility]isted living  [facility] [place].    Patient does not have any children.   Patient is retired.   Patient has some college education.   Patient is right-handed.   Patient drinks four cups of coffee daily, one cup of soda daily.   Social Drivers of Corporate investment banker Strain: Not on file  Food Insecurity: Not on file  Transportation Needs: Not on file  Physical Activity: Not on file  Stress: Not on file  Social Connections: Not on file    Family History  Problem Relation Age of Onset   Parkinson's disease Mother        versus lewy body dementia   Diabetes Other    Seizures Other        grand mal   Stroke Maternal Grandmother    Heart disease Maternal Grandmother    Stroke Maternal Grandfather    Heart disease Maternal Grandfather    Stroke Paternal Grandmother    Heart disease Paternal Grandmother    Stroke Paternal Grandfather    Heart disease Paternal Grandfather    Mental retardation Cousin     Past Medical History:  Diagnosis Date   Aplastic anemia (HCC)    Epilepsy (HCC)    Epilepsy without status epilepticus, not  intractable (HCC) 06/04/2012   Memory loss 06/04/2012   Right hip pain 06/04/2012   Tremor     Past Surgical History:  Procedure Laterality Date   CATARACT EXTRACTION     CRANIOTOMY Right    frontal     Current Outpatient Medications on File Prior to Visit  Medication Sig Dispense Refill   acetaminophen (TYLENOL) 500 MG tablet Take 1,000 mg by mouth every 6 (six) hours as needed.     alendronate (FOSAMAX) 70 MG tablet Take 70 mg by mouth once a week.  Aloe Vera 500 MG CAPS Take by mouth.     aspirin 81 MG tablet Take 81 mg by mouth daily.     atorvastatin (LIPITOR) 20 MG tablet Take 20 mg by mouth daily.     divalproex (DEPAKOTE ER) 500 MG 24 hr tablet Take 2 tablets (1,000 mg total) by mouth daily. 60 tablet 5   Emollient (MINERIN) LOTN Apply 1 Application topically 2 (two) times daily as needed.     fenofibrate (TRICOR) 145 MG tablet Take 145 mg by mouth daily.     ferrous sulfate 325 (65 FE) MG tablet Take 325 mg by mouth 2 (two) times daily with a meal.     finasteride (PROSCAR) 5 MG tablet Take 5 mg by mouth daily.      furosemide (LASIX) 40 MG tablet Take 40 mg by mouth daily.     gabapentin (NEURONTIN) 100 MG capsule Take 100 mg by mouth 3 (three) times daily.     lacosamide (VIMPAT) 200 MG TABS tablet TAKE 1 TAB TABLET BY MOUTH TWICE A DAY 60 tablet 5   levothyroxine (SYNTHROID) 112 MCG tablet Take 112 mcg by mouth daily.     linaclotide (LINZESS) 290 MCG CAPS capsule Take 290 mcg by mouth. Every 48 hours.     loratadine (CLARITIN) 10 MG tablet Take 10 mg by mouth 2 (two) times daily.     metFORMIN (GLUCOPHAGE) 500 MG tablet Take 500 mg by mouth daily with breakfast.     metoprolol tartrate (LOPRESSOR) 25 MG tablet Take 12.5 mg by mouth 2 (two) times daily.     mirabegron ER (MYRBETRIQ) 50 MG TB24 tablet Take 50 mg by mouth daily.     Multiple Vitamin (MULTIVITAMIN) tablet Take 1 tablet by mouth daily.     omeprazole (PRILOSEC) 20 MG capsule      ondansetron (ZOFRAN) 4 MG  tablet Take 4 mg by mouth every 6 (six) hours as needed for nausea or vomiting.     oxymetazoline (AFRIN) 0.05 % nasal spray Place 2 sprays into both nostrils 2 (two) times daily.     Red Yeast Rice Extract 600 MG CAPS Take by mouth. 2 capsule by mouth once daily     selenium sulfide (SELSUN) 1 % LOTN Apply 1 Application topically daily.     sodium fluoride (PREVIDENT 5000 PLUS) 1.1 % CREA dental cream Place 1 application onto teeth every evening.     traZODone (DESYREL) 150 MG tablet Take 150 mg by mouth at bedtime. 2 tablets by mouth at bedtime     triamcinolone cream (KENALOG) 0.1 % Apply 1 application topically daily.     ammonium lactate (LAC-HYDRIN) 12 % lotion Apply 1 Application topically as needed for dry skin. (Patient not taking: Reported on 04/25/2023)     AMOXICILLIN PO Take 1 tablet by mouth 3 times/day as needed-between meals & bedtime (Take 1 hour prior to dental appointment). (Patient not taking: Reported on 04/25/2023)     Artificial Tear Ointment (ARTIFICIAL TEARS) ointment Place into both eyes 3 (three) times daily. (Patient not taking: Reported on 04/25/2023)     bimatoprost (LUMIGAN) 0.01 % SOLN Place 1 drop into both eyes at bedtime. (Patient not taking: Reported on 04/25/2023)     bimatoprost (LUMIGAN) 0.01 % SOLN 1 drop at bedtime. (Patient not taking: Reported on 04/25/2023)     calcipotriene (DOVONOX) 0.005 % cream Apply 1 application topically 2 (two) times daily. (Patient not taking: Reported on 04/25/2023)     Calcium Carb-Cholecalciferol (OYSTER  SHELL CALCIUM + D PO) Take 1 tablet by mouth daily. (Patient not taking: Reported on 04/25/2023)     clobetasol cream (TEMOVATE) 0.05 % Apply 1 Application topically 2 (two) times daily. (Patient not taking: Reported on 04/25/2023)     Emollient (MINERIN) LOTN Apply topically. (Patient not taking: Reported on 04/25/2023)     EPINEPHrine 0.3 mg/0.3 mL IJ SOAJ injection Inject 0.3 mg into the muscle as needed. (Patient not taking: Reported  on 04/25/2023)     halobetasol (ULTRAVATE) 0.05 % cream Apply 1 application topically 2 (two) times daily. (Patient not taking: Reported on 04/25/2023)     halobetasol (ULTRAVATE) 0.05 % cream Apply topically 2 (two) times daily. (Patient not taking: Reported on 04/25/2023)     hydroxypropyl methylcellulose / hypromellose (ISOPTO TEARS / GONIOVISC) 2.5 % ophthalmic solution 1 drop. (Patient not taking: Reported on 04/25/2023)     ketoconazole (NIZORAL) 2 % shampoo Apply 1 application topically 2 (two) times a week. Monday Jordan Friday (Patient not taking: Reported on 04/25/2023)     melatonin 3 MG TABS tablet Take 3 mg by mouth at bedtime. (Patient not taking: Reported on 04/25/2023)     nystatin cream (MYCOSTATIN) Apply 1 application topically daily.  (Patient not taking: Reported on 04/25/2023)     tamsulosin (FLOMAX) 0.4 MG CAPS capsule Take 0.4 mg by mouth daily. (Patient not taking: Reported on 04/25/2023)     tiZANidine (ZANAFLEX) 2 MG tablet Take 2 mg by mouth 2 (two) times daily. (Patient not taking: Reported on 04/25/2023)     No current facility-administered medications on file prior to visit.    Allergies  Allergen Reactions   Bupropion     Other reaction(s): Other (See Comments)   Carbamazepine     Other reaction(s): Other (See Comments)   Codeine     Other reaction(s): Confusion (intolerance)   Donepezil Hcl     Other reaction(s): Other (See Comments)   Hydrocodone    Ibuprofen Diarrhea   Mephenytoin     Other reaction(s): Other (See Comments)   Topiramate     Other reaction(s): Other (See Comments)   Zonisamide     Other reaction(s): Other (See Comments)     DIAGNOSTIC DATA (LABS, IMAGING, TESTING) - I reviewed patient records, labs, notes, testing Jordan imaging myself where available.  Lab Results  Component Value Date   WBC 4.0 12/19/2022   HGB 10.8 (L) 12/19/2022   HCT 32.6 (L) 12/19/2022   MCV 100.6 (H) 12/19/2022   PLT 166 12/19/2022      Component Value Date/Time    NA 139 10/20/2022 1423   K 4.5 10/20/2022 1423   CL 100 10/20/2022 1423   CO2 25 10/20/2022 1423   GLUCOSE 129 (H) 10/20/2022 1423   GLUCOSE 116 (H) 01/10/2022 1328   BUN 16 10/20/2022 1423   CREATININE 0.96 10/20/2022 1423   CREATININE 1.15 01/10/2022 1328   CALCIUM 9.5 10/20/2022 1423   PROT 6.9 10/20/2022 1423   ALBUMIN 4.1 10/20/2022 1423   AST 21 10/20/2022 1423   AST 24 01/10/2022 1328   ALT 20 10/20/2022 1423   ALT 26 01/10/2022 1328   ALKPHOS 42 (L) 10/20/2022 1423   BILITOT <0.2 10/20/2022 1423   BILITOT 0.3 01/10/2022 1328   GFRNONAA >60 01/10/2022 1328   GFRAA 100 03/25/2020 1155   No results found for: "CHOL", "HDL", "LDLCALC", "LDLDIRECT", "TRIG", "CHOLHDL" No results found for: "HGBA1C" Lab Results  Component Value Date   VITAMINB12 393 10/20/2022  Lab Results  Component Value Date   TSH 2.750 10/20/2022    PHYSICAL EXAM:  Thomas's Vitals   04/25/23 1415  BP: (!) 103/59  Pulse: 90  Weight: 203 lb 8 oz (92.3 kg)  Height: 5\' 6"  (1.676 m)   Body mass index is 32.85 kg/m.   Wt Readings from Last 3 Encounters:  04/25/23 203 lb 8 oz (92.3 kg)  12/19/22 209 lb 4.8 oz (94.9 kg)  10/20/22 211 lb 12.8 oz (96.1 kg)     Ht Readings from Last 3 Encounters:  04/25/23 5\' 6"  (1.676 m)  12/19/22 5\' 6"  (1.676 m)  10/20/22 5\' 6"  (1.676 m)      General: The patient is awake, alert Jordan appears not in acute distress. The patient is well groomed. Head: Normocephalic, atraumatic.      10/20/2022    2:03 PM 10/22/2013    2:30 PM  MMSE - Mini Mental State Exam  Orientation to time 4 5  Orientation to Place 5 5  Registration 3 3  Attention/ Calculation 3 5  Recall 3 3  Language- name 2 objects 2 2  Language- repeat 1 1  Language- follow 3 step command 3 3  Language- read & follow direction 1 1  Write a sentence 1 1  Copy design 0 0  Total score 26 29        10/20/2022    2:03 PM 10/22/2013    2:30 PM  MMSE - Mini Mental State Exam  Orientation to time  4 5  Orientation to Place 5 5  Registration 3 3  Attention/ Calculation 3 5  Recall 3 3  Language- name 2 objects 2 2  Language- repeat 1 1  Language- follow 3 step command 3 3  Language- read & follow direction 1 1  Write a sentence 1 1  Copy design 0 0  Total score 26 29    MMSE 28/ 30 points.  Couldn't draw the pentagrams,  did well with clock face.  Lost one point on serial 7 th.  Appears inattentive.    Generalized: Well developed, in no acute distress, teeth in the front are chipped   Neurological examination  Mentation: Alert oriented to time, place, history taking. Follows all commands speech Jordan language fluent. He is a little animated,  Cranial nerve: Pupils were equal in size, round, promptly  reactive to light.  Extraocular movements were full, visual field were full on confrontational test.  Head turning Jordan shoulder shrug  were normal Jordan symmetric.  Motor: Full strength  of all 4 extremities. Good symmetric motor tone is noted throughout.  Sensory: Sensory testing is intact to soft touch on all 4 extremities. No evidence of extinction is noted.  Coordination: Cerebellar testing reveals good finger-nose-finger Jordan heel-to-shin bilaterally.  Coordination: without evidence of mild  tremor.   Gait Jordan station: Patient could rise unassisted from a seated position, walked without assistive device.  Stance is of normal width/ base Jordan the patient turned with  s steps.  Toe Jordan heel walk were deferred.  Deep tendon reflexes: in the  upper Jordan lower extremities are symmetric Jordan intact.  Babinski response was deferred   ASSESSMENT Jordan PLAN    78 year old male  has a past medical history of Aplastic anemia (HCC), Epilepsy (HCC), Epilepsy without status epilepticus, not intractable (HCC) (06/04/2012), Memory loss (06/04/2012), Right hip pain (06/04/2012), Jordan Tremor. Subjective memory loss Jordan aphasia.  here with:     1)  well controlled seizure disorder , on the same meds for  years.  He Jordan his siter like to stay on 1000 g Depakote a day, taken currently at night.   2) no evidence of cognitive decline.  He is surrounded by other residents Jordan sometimes has " unfiltered"  emotional  reactions.  Frustrations.   3) I don;'t see a reason to introduce aricept, he had not tolerated in the past !   4) hydration ! Keep it up. No vertigo , no lightheadedness.   5) myrbetriq has still anticholinergic effect.       I plan to follow up either personally within 12 months.  NP in 6 months   I would like to thank Kirvin, Myrene Galas, MD Jordan [place], Myrene Galas, Md [address] [place],  [place] [address] for allowing me to meet with Jordan to take care of this pleasant patient.     After spending a total time of  35  minutes face to face Jordan additional time for physical Jordan neurologic examination, review of laboratory studies,  personal review of imaging studies, reports Jordan results of other testing Jordan review of referral information / records as far as provided in visit,   Electronically signed by: Melvyn Novas, MD 04/25/2023 3:01 PM  [facility] [employer] Board certified by The [employer] of Sleep Medicine Jordan Diplomate of the [employer] of [employer]. Board certified In Neurology through the ABPN, Fellow of the [employer] of [employer].

## 2023-04-25 NOTE — Addendum Note (Signed)
 Addended by: Melvyn Novas on: 04/25/2023 03:46 PM   Modules accepted: Orders

## 2023-04-25 NOTE — Patient Instructions (Addendum)
 Kept on Vimpat and Depakote and  will get levels today,   Management of Memory Problems  There are some general things you can do to help manage your memory problems.  Your memory may not in fact recover, but by using techniques and strategies you will be able to manage your memory difficulties better.  1)  Establish a routine. Try to establish and then stick to a regular routine.  By doing this, you will get used to what to expect and you will reduce the need to rely on your memory.  Also, try to do things at the same time of day, such as taking your medication or checking your calendar first thing in the morning. Think about think that you can do as a part of a regular routine and make a list.  Then enter them into a daily planner to remind you.  This will help you establish a routine.  2)  Organize your environment. Organize your environment so that it is uncluttered.  Decrease visual stimulation.  Place everyday items such as keys or cell phone in the same place every day (ie.  Basket next to front door) Use post it notes with a brief message to yourself (ie. Turn off light, lock the door) Use labels to indicate where things go (ie. Which cupboards are for food, dishes, etc.) Keep a notepad and pen by the telephone to take messages  3)  Memory Aids A diary or journal/notebook/daily planner Making a list (shopping list, chore list, to do list that needs to be done) Using an alarm as a reminder (kitchen timer or cell phone alarm) Using cell phone to store information (Notes, Calendar, Reminders) Calendar/White board placed in a prominent position Post-it notes  In order for memory aids to be useful, you need to have good habits.  It's no good remembering to make a note in your journal if you don't remember to look in it.  Try setting aside a certain time of day to look in journal.  4)  Improving mood and managing fatigue. There may be other factors that contribute to memory difficulties.   Factors, such as anxiety, depression and tiredness can affect memory. Regular gentle exercise can help improve your mood and give you more energy. Simple relaxation techniques may help relieve symptoms of anxiety Try to get back to completing activities or hobbies you enjoyed doing in the past. Learn to pace yourself through activities to decrease fatigue. Find out about some local support groups where you can share experiences with others. Try and achieve 7-8 hours of sleep at night.

## 2023-04-26 ENCOUNTER — Encounter: Payer: Self-pay | Admitting: Neurology

## 2023-04-26 DIAGNOSIS — G40209 Localization-related (focal) (partial) symptomatic epilepsy and epileptic syndromes with complex partial seizures, not intractable, without status epilepticus: Secondary | ICD-10-CM

## 2023-04-26 DIAGNOSIS — Z79899 Other long term (current) drug therapy: Secondary | ICD-10-CM

## 2023-04-26 MED ORDER — LACOSAMIDE 200 MG PO TABS: ORAL_TABLET | ORAL | 5 refills | Status: DC

## 2023-04-26 MED ORDER — DIVALPROEX SODIUM ER 500 MG PO TB24
1000.0000 mg | ORAL_TABLET | Freq: Every day | ORAL | 5 refills | Status: DC
Start: 2023-04-26 — End: 2023-04-26

## 2023-04-26 MED ORDER — MELATONIN 3 MG PO TABS: 3.0000 mg | ORAL_TABLET | Freq: Every day | ORAL | 5 refills | Status: DC

## 2023-04-26 MED ORDER — DIVALPROEX SODIUM ER 500 MG PO TB24: 1000.0000 mg | ORAL_TABLET | Freq: Every day | ORAL | 5 refills | Status: DC

## 2023-04-26 MED ORDER — MELATONIN 3 MG PO TABS: 3.0000 mg | ORAL_TABLET | Freq: Every day | ORAL | 1 refills | Status: DC

## 2023-04-26 NOTE — Telephone Encounter (Signed)
 We have the pharmacy for patient information now. Rx is pending to be siged for Vimpat 200 mg tablets.

## 2023-04-29 LAB — COMPREHENSIVE METABOLIC PANEL
ALT: 14 IU/L (ref 0–44)
AST: 15 IU/L (ref 0–40)
Albumin: 4.3 g/dL (ref 3.8–4.8)
Alkaline Phosphatase: 38 IU/L — ABNORMAL LOW (ref 44–121)
BUN/Creatinine Ratio: 18 (ref 10–24)
BUN: 20 mg/dL (ref 8–27)
Bilirubin Total: <0.2 mg/dL (ref 0.0–1.2)
CO2: 27 mmol/L (ref 20–29)
Calcium: 9.6 mg/dL (ref 8.6–10.2)
Chloride: 101 mmol/L (ref 96–106)
Creatinine, Ser: 1.14 mg/dL (ref 0.76–1.27)
Globulin, Total: 2.8 g/dL (ref 1.5–4.5)
Glucose: 86 mg/dL (ref 70–99)
Potassium: 4.6 mmol/L (ref 3.5–5.2)
Sodium: 139 mmol/L (ref 134–144)
Total Protein: 7.1 g/dL (ref 6.0–8.5)
eGFR: 67 mL/min/{1.73_m2} (ref >59)

## 2023-04-29 LAB — LACOSAMIDE: Lacosamide: 11.6 ug/mL — ABNORMAL HIGH (ref 5.0–10.0)

## 2023-04-29 LAB — CBC WITH DIFFERENTIAL/PLATELET
Basophils Absolute: 0 10*3/uL (ref 0.0–0.2)
Basos: 1 %
EOS (ABSOLUTE): 0 10*3/uL (ref 0.0–0.4)
Eos: 1 %
Hematocrit: 33.7 % — ABNORMAL LOW (ref 37.5–51.0)
Hemoglobin: 10.7 g/dL — ABNORMAL LOW (ref 13.0–17.7)
Immature Grans (Abs): 0 10*3/uL (ref 0.0–0.1)
Immature Granulocytes: 1 %
Lymphocytes Absolute: 1.9 10*3/uL (ref 0.7–3.1)
Lymphs: 40 %
MCH: 31.8 pg (ref 26.6–33.0)
MCHC: 31.8 g/dL (ref 31.5–35.7)
MCV: 100 fL — ABNORMAL HIGH (ref 79–97)
Monocytes Absolute: 0.5 10*3/uL (ref 0.1–0.9)
Monocytes: 10 %
Neutrophils Absolute: 2.2 10*3/uL (ref 1.4–7.0)
Neutrophils: 47 %
Platelets: 185 10*3/uL (ref 150–450)
RBC: 3.37 x10E6/uL — ABNORMAL LOW (ref 4.14–5.80)
RDW: 13.7 % (ref 11.6–15.4)
WBC: 4.7 10*3/uL (ref 3.4–10.8)

## 2023-04-29 LAB — VALPROIC ACID LEVEL: Valproic Acid Lvl: 38 ug/mL — ABNORMAL LOW (ref 50–100)

## 2023-04-30 ENCOUNTER — Encounter: Payer: Self-pay | Admitting: Neurology

## 2023-05-02 NOTE — Telephone Encounter (Signed)
 LVM for patient or sister Reola Calkins on [place]) to call back and discuss labwork results

## 2023-05-02 NOTE — Telephone Encounter (Signed)
 Spoke to sister (checked DPR ) aware of lab results will send message to [facility] living where patient resides and let them know pt has to see PCP due to labwork results sister did request to send labwork results to pcp . Informed sister  PCP labwork results sent to PCP this am Sister expressed understanding and thanked me for calling

## 2023-06-18 NOTE — Progress Notes (Deleted)
 [facility]  [address] [place],  [place]  [address] [phone]  Clinic Day:  06/18/2023  Referring physician: Georgean Kindle, MD   HISTORY OF PRESENT ILLNESS:  The patient is a 78 y.o. male with mild anemia.  As his hemoglobin has been above 10, his anemia has been followed conservatively.  He comes in today to reassess his hemoglobin.  Since his last visit, the patient has been doing well.  He denies having increased fatigue or any overt forms of blood loss which concern him for progressive anemia.    PHYSICAL EXAM:  There were no vitals taken for this visit. Wt Readings from Last 3 Encounters:  04/25/23 203 lb 8 oz (92.3 kg)  12/19/22 209 lb 4.8 oz (94.9 kg)  10/20/22 211 lb 12.8 oz (96.1 kg)   There is no height or weight on file to calculate BMI. Performance status (ECOG): 1 - Symptomatic but completely ambulatory Physical Exam Constitutional:      Appearance: Normal appearance. He is not ill-appearing.  HENT:     Mouth/Throat:     Mouth: Mucous membranes are moist.     Pharynx: Oropharynx is clear. No oropharyngeal exudate or posterior oropharyngeal erythema.  Cardiovascular:     Rate and Rhythm: Normal rate and regular rhythm.     Heart sounds: No murmur heard.    No friction rub. No gallop.  Pulmonary:     Effort: Pulmonary effort is normal. No respiratory distress.     Breath sounds: Normal breath sounds. No wheezing, rhonchi or rales.  Abdominal:     General: Bowel sounds are normal. There is no distension.     Palpations: Abdomen is soft. There is no mass.     Tenderness: There is no abdominal tenderness.  Musculoskeletal:        General: No swelling.     Right lower leg: No edema.     Left lower leg: No edema.  Lymphadenopathy:     Cervical: No cervical adenopathy.     Upper Body:     Right upper body: No supraclavicular or axillary adenopathy.     Left upper body: No supraclavicular or axillary adenopathy.     Lower  Body: No right inguinal adenopathy. No left inguinal adenopathy.  Skin:    General: Skin is warm.     Coloration: Skin is not jaundiced.     Findings: No lesion or rash.  Neurological:     General: No focal deficit present.     Mental Status: He is alert and oriented to person, place, and time. Mental status is at baseline.  Psychiatric:        Mood and Affect: Mood normal.        Behavior: Behavior normal.        Thought Content: Thought content normal.     LABS:      Latest Ref Rng & Units 04/25/2023    3:48 PM 12/19/2022   10:29 AM 10/20/2022    2:23 PM  CBC  WBC 3.4 - 10.8 x10E3/uL 4.7  4.0  3.5   Hemoglobin 13.0 - 17.7 g/dL 93.2  35.5  73.2   Hematocrit 37.5 - 51.0 % 33.7  32.6  32.8   Platelets 150 - 450 x10E3/uL 185  166  174       Latest Ref Rng & Units 04/25/2023    3:48 PM 10/20/2022    2:23 PM 01/10/2022    1:28 PM  CMP  Glucose 70 - 99 mg/dL 86  960  454   BUN 8 - 27 mg/dL 20  16  20    Creatinine 0.76 - 1.27 mg/dL 0.98  1.19  1.47   Sodium 134 - 144 mmol/L 139  139  140   Potassium 3.5 - 5.2 mmol/L 4.6  4.5  3.9   Chloride 96 - 106 mmol/L 101  100  105   CO2 20 - 29 mmol/L 27  25  28    Calcium 8.6 - 10.2 mg/dL 9.6  9.5  8.6   Total Protein 6.0 - 8.5 g/dL 7.1  6.9  7.4   Total Bilirubin 0.0 - 1.2 mg/dL <8.2  <9.5  0.3   Alkaline Phos 44 - 121 IU/L 38  42  34   AST 0 - 40 IU/L 15  21  24    ALT 0 - 44 IU/L 14  20  26     ASSESSMENT & PLAN:  Assessment/Plan:  A 78 y.o. male with mild anemia.  When evaluating his labs today, I am pleased that his hemoglobin remains adequate.  Of note, he chronically takes Depakote  twice daily.  As long as his hemoglobin remains well above 10, his anemia will continue to be followed conservatively.  I will see him back in another 6 months for repeat clinical assessment.  The patient understands all the plans discussed today and is in agreement with them.     Latressa Harries Felicia Horde, MD

## 2023-06-19 ENCOUNTER — Other Ambulatory Visit

## 2023-06-19 ENCOUNTER — Ambulatory Visit: Admitting: Oncology

## 2023-06-19 ENCOUNTER — Inpatient Hospital Stay: Payer: Medicare Other | Admitting: Oncology

## 2023-06-19 ENCOUNTER — Inpatient Hospital Stay: Payer: Medicare Other

## 2023-06-27 ENCOUNTER — Inpatient Hospital Stay (HOSPITAL_BASED_OUTPATIENT_CLINIC_OR_DEPARTMENT_OTHER): Admitting: Oncology

## 2023-06-27 ENCOUNTER — Telehealth: Payer: Self-pay | Admitting: Oncology

## 2023-06-27 ENCOUNTER — Inpatient Hospital Stay: Attending: Oncology

## 2023-06-27 ENCOUNTER — Other Ambulatory Visit: Payer: Self-pay | Admitting: Oncology

## 2023-06-27 VITALS — BP 133/75 | HR 95 | Temp 98.1°F | Resp 16 | Ht 66.0 in | Wt 199.1 lb

## 2023-06-27 DIAGNOSIS — D649 Anemia, unspecified: Secondary | ICD-10-CM

## 2023-06-27 DIAGNOSIS — Z79899 Other long term (current) drug therapy: Secondary | ICD-10-CM | POA: Diagnosis not present

## 2023-06-27 LAB — CBC WITH DIFFERENTIAL (CANCER CENTER ONLY)
Abs Immature Granulocytes: 0.02 10*3/uL (ref 0.00–0.07)
Basophils Absolute: 0 10*3/uL (ref 0.0–0.1)
Basophils Relative: 1 %
Eosinophils Absolute: 0 10*3/uL (ref 0.0–0.5)
Eosinophils Relative: 1 %
HCT: 33.4 % — ABNORMAL LOW (ref 39.0–52.0)
Hemoglobin: 10.7 g/dL — ABNORMAL LOW (ref 13.0–17.0)
Immature Granulocytes: 0 %
Lymphocytes Relative: 41 %
Lymphs Abs: 2 10*3/uL (ref 0.7–4.0)
MCH: 31.8 pg (ref 26.0–34.0)
MCHC: 32 g/dL (ref 30.0–36.0)
MCV: 99.4 fL (ref 80.0–100.0)
Monocytes Absolute: 0.5 10*3/uL (ref 0.1–1.0)
Monocytes Relative: 10 %
Neutro Abs: 2.3 10*3/uL (ref 1.7–7.7)
Neutrophils Relative %: 47 %
Platelet Count: 187 10*3/uL (ref 150–400)
RBC: 3.36 MIL/uL — ABNORMAL LOW (ref 4.22–5.81)
RDW: 15.4 % (ref 11.5–15.5)
WBC Count: 4.8 10*3/uL (ref 4.0–10.5)
nRBC: 0 % (ref 0.0–0.2)

## 2023-06-27 LAB — CMP (CANCER CENTER ONLY)
ALT: 13 U/L (ref 0–44)
AST: 17 U/L (ref 15–41)
Albumin: 4.1 g/dL (ref 3.5–5.0)
Alkaline Phosphatase: 36 U/L — ABNORMAL LOW (ref 38–126)
Anion gap: 10 (ref 5–15)
BUN: 21 mg/dL (ref 8–23)
CO2: 28 mmol/L (ref 22–32)
Calcium: 10.2 mg/dL (ref 8.9–10.3)
Chloride: 102 mmol/L (ref 98–111)
Creatinine: 1.1 mg/dL (ref 0.61–1.24)
GFR, Estimated: >60 mL/min (ref >60)
Glucose, Bld: 91 mg/dL (ref 70–99)
Potassium: 4.3 mmol/L (ref 3.5–5.1)
Sodium: 140 mmol/L (ref 135–145)
Total Bilirubin: <0.2 mg/dL (ref 0.0–1.2)
Total Protein: 7.3 g/dL (ref 6.5–8.1)

## 2023-06-27 NOTE — Telephone Encounter (Signed)
 Patient has been scheduled for follow-up visit per 06/26/23 LOS.  Pt given an appt calendar with date and time.

## 2023-06-27 NOTE — Progress Notes (Signed)
 [facility]  [address] [place],  [place]  [address] [phone]  Clinic Day:  06/27/2023  Referring physician: Georgean Kindle, MD   HISTORY OF PRESENT ILLNESS:  The patient is a 78 y.o. male with mild anemia.  As his hemoglobin has been consistently almost I think above 10, his anemia has been followed conservatively.  He comes in today to reassess his hemoglobin.  Since his last visit, the patient has been doing well.  He denies having increased fatigue or any overt forms of blood loss which concern him for progressive anemia.    PHYSICAL EXAM:  Blood pressure 133/75, pulse 95, temperature 98.1 F (36.7 C), temperature source Oral, resp. rate 16, height 5\' 6"  (1.676 m), weight 199 lb 1.6 oz (90.3 kg), SpO2 96%. Wt Readings from Last 3 Encounters:  06/27/23 199 lb 1.6 oz (90.3 kg)  04/25/23 203 lb 8 oz (92.3 kg)  12/19/22 209 lb 4.8 oz (94.9 kg)   Body mass index is 32.14 kg/m. Performance status (ECOG): 1 - Symptomatic but completely ambulatory Physical Exam Constitutional:      Appearance: Normal appearance. He is not ill-appearing.  HENT:     Mouth/Throat:     Mouth: Mucous membranes are moist.     Pharynx: Oropharynx is clear. No oropharyngeal exudate or posterior oropharyngeal erythema.  Cardiovascular:     Rate and Rhythm: Normal rate and regular rhythm.     Heart sounds: No murmur heard.    No friction rub. No gallop.  Pulmonary:     Effort: Pulmonary effort is normal. No respiratory distress.     Breath sounds: Normal breath sounds. No wheezing, rhonchi or rales.  Abdominal:     General: Bowel sounds are normal. There is no distension.     Palpations: Abdomen is soft. There is no mass.     Tenderness: There is no abdominal tenderness.  Musculoskeletal:        General: No swelling.     Right lower leg: No edema.     Left lower leg: No edema.  Lymphadenopathy:     Cervical: No cervical adenopathy.     Upper Body:     Right  upper body: No supraclavicular or axillary adenopathy.     Left upper body: No supraclavicular or axillary adenopathy.     Lower Body: No right inguinal adenopathy. No left inguinal adenopathy.  Skin:    General: Skin is warm.     Coloration: Skin is not jaundiced.     Findings: No lesion or rash.  Neurological:     General: No focal deficit present.     Mental Status: He is alert and oriented to person, place, and time. Mental status is at baseline.  Psychiatric:        Mood and Affect: Mood normal.        Behavior: Behavior normal.        Thought Content: Thought content normal.     LABS:      Latest Ref Rng & Units 06/27/2023    1:03 PM 04/25/2023    3:48 PM 12/19/2022   10:29 AM  CBC  WBC 4.0 - 10.5 K/uL 4.8  4.7  4.0   Hemoglobin 13.0 - 17.0 g/dL 91.4  78.2  95.6   Hematocrit 39.0 - 52.0 % 33.4  33.7  32.6   Platelets 150 - 400 K/uL 187  185  166       Latest Ref Rng & Units  06/27/2023    1:03 PM 04/25/2023    3:48 PM 10/20/2022    2:23 PM  CMP  Glucose 70 - 99 mg/dL 91  86  161   BUN 8 - 23 mg/dL 21  20  16    Creatinine 0.61 - 1.24 mg/dL 0.96  0.45  4.09   Sodium 135 - 145 mmol/L 140  139  139   Potassium 3.5 - 5.1 mmol/L 4.3  4.6  4.5   Chloride 98 - 111 mmol/L 102  101  100   CO2 22 - 32 mmol/L 28  27  25    Calcium 8.9 - 10.3 mg/dL 81.1  9.6  9.5   Total Protein 6.5 - 8.1 g/dL 7.3  7.1  6.9   Total Bilirubin 0.0 - 1.2 mg/dL <9.1  <4.7  <8.2   Alkaline Phos 38 - 126 U/L 36  38  42   AST 15 - 41 U/L 17  15  21    ALT 0 - 44 U/L 13  14  20     ASSESSMENT & PLAN:  Assessment/Plan:  A 78 y.o. male with mild anemia.  When evaluating his labs today, I am pleased that his hemoglobin remains stable  Of note, he chronically takes Depakote  twice daily, which could be impacting his hemoglobin.  As long as his hemoglobin remains well above 10, his anemia will continue to be followed conservatively.  I will see him back in another 6 months for repeat clinical assessment.  The  patient understands all the plans discussed today and is in agreement with them.     Gus Littler Felicia Horde, MD

## 2023-11-02 ENCOUNTER — Encounter: Payer: Self-pay | Admitting: Adult Health

## 2023-11-02 ENCOUNTER — Ambulatory Visit: Admitting: Adult Health

## 2023-11-02 VITALS — BP 117/69 | HR 94 | Ht 66.0 in | Wt 201.0 lb

## 2023-11-02 DIAGNOSIS — G40209 Localization-related (focal) (partial) symptomatic epilepsy and epileptic syndromes with complex partial seizures, not intractable, without status epilepticus: Secondary | ICD-10-CM | POA: Diagnosis not present

## 2023-11-02 DIAGNOSIS — R413 Other amnesia: Secondary | ICD-10-CM

## 2023-11-02 MED ORDER — LACOSAMIDE 200 MG PO TABS: ORAL_TABLET | ORAL | 5 refills | Status: AC

## 2023-11-02 MED ORDER — DIVALPROEX SODIUM ER 500 MG PO TB24: 1000.0000 mg | ORAL_TABLET | Freq: Every day | ORAL | 5 refills | Status: AC

## 2023-11-02 NOTE — Progress Notes (Signed)
 PATIENT: Jordan Thomas DOB: 1946/01/24  REASON FOR VISIT: follow up HISTORY FROM: patient  Chief Complaint  Patient presents with   Follow-up    Pt in 18 with sister Pt here for seizure/ memory f/u Pt states same since last visit     HISTORY OF PRESENT ILLNESS: Today 11/02/23:  Jordan Thomas is a 78 y.o. male with a history of seizures. Returns today for follow-up.  Overall he reports that he has been doing well.  He is here today with his sister.  He denies any seizure events.  He continues to reside at [facility].  He feels that his memory has been relatively stable.  His sister states that he recently had to change his room and she noticed some confusion then.  She also feels that some of his medications may be causing some confusion.  Unfortunately the nursing facility did not send his MAR with him today.  At the facility the patient reports that he is able to complete all ADLs independently.  He returns today for an evaluation.   10/20/22:Jordan Thomas is a 78 y.o. male with a history of seizures. Returns today for follow-up.  He remains on Depakote  extended release 500 mg 2 tablets daily and Vimpat  200 mg twice a day.  Resides in a group home. Reports that he had one episode this week- describes it as crossing his arms and shaking. He was completely aware, no LOC, did not bite tongue or cheek, no urination.   Reports some memory disturbance.  States that he Has a disc player and forgets how to turn it on or off. Able to complete all ADLS independently. Sister manages his finances. Sleep patterns vary. May stay up late watching TV.  Returns today for an evaluation   04/28/22: Jordan Thomas is a 78 y.o. male with a history of Seizures. Returns today for follow-up.  He remains on Depakote  extended release 500 mg 2 tablets daily and Vimpat  200 mg twice a day. Continues to live at a group home.  Had a seizure event last week. Was not witnessed but patient went and told  caregivers. Reports that he knocked two teeth out. Clemens on his face. He is not sure how long he was out.  Group home did not send him to the ED.  Sister reports that the nurse at the group home reports that they missed giving him his vimpat  for two nights in a row.  Patient states that he has not had a seizure event in 4 years until last week.    03/25/20: Jordan Thomas is a 78 year old male with a history of seizures.  He returns today for follow-up.  He is currently on Depakote  2 tablets daily and Vimpat  200 mg twice a day.  He states that he has been waking up with sore lips each night for the past 2 weeks.  He feels that he may be having nocturnal seizure events.  There is been no witnessed events.  He lives in a group home.  Denies missing any medication.  He returns today for an evaluation.  HISTORY (Copied from Dr.Dohmeier's note) 10-09-2019;  I have the pleasure of meeting Ms. Jordan Thomas today who is a 78 year old Caucasian gentleman living in an extended care facility here in [place] .  He has a longstanding history of a seizure disorder and is usually accompanied by a family member.  Overall he had no new seizure-like activity out of the ordinary, but he  was just 2 month ago diagnosed with diabetes. He remains on Vimpat  200 mg tablets, he is on valproic acid  250 mg 2 tablets by mouth 2 times a day for seizures.  Metformin, omeprazole, multivitamin, levothyroxine sodium 100 mcg, alendronate, aspirin 81 mg chewable a day, his glucose was monitored twice daily at the facility, he uses melatonin to sleep, he has Lumigan eyedrops, Linzess capsules for irritable bowel, desmopressin for nocturia, finasteride for prostate hyperplasia,  Claritin as needed for sinus and allergies, atorvastatin for hypercholesterolemia and he can use Zofran for nausea and Tylenol for pain or fever.  So at this time I do not see any medication aside of valproate that would make him gain weight or increase his  blood sugar level.    REVIEW OF SYSTEMS: Out of a complete 14 system review of symptoms, the patient complains only of the following symptoms, and all other reviewed systems are negative.  ALLERGIES: Allergies  Allergen Reactions   Bupropion     Other reaction(s): Other (See Comments)   Carbamazepine     Other reaction(s): Other (See Comments)   Codeine     Other reaction(s): Confusion (intolerance)   Donepezil Hcl     Other reaction(s): Other (See Comments)   Hydrocodone    Ibuprofen Diarrhea   Mephenytoin     Other reaction(s): Other (See Comments)   Topiramate     Other reaction(s): Other (See Comments)   Zonisamide     Other reaction(s): Other (See Comments)    HOME MEDICATIONS: Outpatient Medications Prior to Visit  Medication Sig Dispense Refill   acetaminophen (TYLENOL) 500 MG tablet Take 1,000 mg by mouth every 6 (six) hours as needed.     alendronate (FOSAMAX) 70 MG tablet Take 70 mg by mouth once a week. Take with a full glass of water on an empty stomach.     Aloe Vera 500 MG CAPS Take by mouth.     aspirin EC 81 MG tablet Take 81 mg by mouth daily. Swallow whole.     atorvastatin (LIPITOR) 20 MG tablet Take 20 mg by mouth daily.     bimatoprost (LUMIGAN) 0.01 % SOLN 1 drop at bedtime.     calcium carbonate (OS-CAL - DOSED IN MG OF ELEMENTAL CALCIUM) 1250 (500 Ca) MG tablet Take 1 tablet by mouth.     carboxymethylcellulose (REFRESH PLUS) 0.5 % SOLN 1 drop 3 (three) times daily as needed.     divalproex  (DEPAKOTE  ER) 500 MG 24 hr tablet Take 2 tablets (1,000 mg total) by mouth daily. 60 tablet 5   EPINEPHrine 0.3 mg/0.3 mL IJ SOAJ injection Inject 0.3 mg into the muscle as needed for anaphylaxis.     fenofibrate (TRICOR) 145 MG tablet Take 145 mg by mouth daily.     ferrous sulfate 325 (65 FE) MG tablet Take 325 mg by mouth 2 (two) times daily with a meal.     finasteride (PROSCAR) 5 MG tablet Take 5 mg by mouth daily.      furosemide (LASIX) 40 MG tablet Take 40  mg by mouth.     gabapentin (NEURONTIN) 100 MG capsule Take 100 mg by mouth 3 (three) times daily.     ketoconazole (NIZORAL) 2 % shampoo Apply 1 Application topically 2 (two) times a week.     lacosamide  (VIMPAT ) 200 MG TABS tablet TAKE 1 TAB TABLET BY MOUTH TWICE A DAY 60 tablet 5   levothyroxine (SYNTHROID) 112 MCG tablet Take 112 mcg by mouth daily before  breakfast.     linaclotide (LINZESS) 290 MCG CAPS capsule Take 290 mcg by mouth daily before breakfast.     loratadine (CLARITIN) 10 MG tablet Take 10 mg by mouth 2 (two) times daily.     metFORMIN (GLUCOPHAGE) 500 MG tablet Take 500 mg by mouth daily with breakfast.     metoprolol tartrate (LOPRESSOR) 25 MG tablet Take 12.5 mg by mouth 2 (two) times daily.     mirabegron ER (MYRBETRIQ) 50 MG TB24 tablet Take 50 mg by mouth at bedtime.     Multiple Vitamin (MULTIVITAMIN) tablet Take 1 tablet by mouth daily.     omeprazole (PRILOSEC) 20 MG capsule      ondansetron (ZOFRAN) 4 MG tablet Take 4 mg by mouth every 6 (six) hours as needed for nausea or vomiting.     oxymetazoline (AFRIN) 0.05 % nasal spray Place 2 sprays into both nostrils 2 (two) times daily.     Red Yeast Rice Extract 600 MG CAPS Take by mouth. 2 capsule by mouth once daily     tamsulosin (FLOMAX) 0.4 MG CAPS capsule Take 0.4 mg by mouth.     traZODone (DESYREL) 150 MG tablet Take by mouth at bedtime.     ammonium lactate (LAC-HYDRIN) 12 % lotion Apply 1 Application topically as needed for dry skin.     No facility-administered medications prior to visit.    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   Memory loss 06/04/2012   Right hip pain 06/04/2012   Tremor     PAST SURGICAL HISTORY: Past Surgical History:  Procedure Laterality Date   CATARACT EXTRACTION     CRANIOTOMY Right    frontal    FAMILY HISTORY: Family History   Problem Relation Age of Onset   Parkinson's disease Mother        versus lewy body dementia   Diabetes Other    Seizures Other        grand mal   Stroke Maternal Grandmother    Heart disease Maternal Grandmother    Stroke Maternal Grandfather    Heart disease Maternal Grandfather    Stroke Paternal Grandmother    Heart disease Paternal Grandmother    Stroke Paternal Grandfather    Heart disease Paternal Grandfather    Mental retardation Cousin     SOCIAL HISTORY: Social History   Socioeconomic History   Marital status: Single    Spouse name: Not on file   Number of children: 0   Years of education: college   Highest education level: Not on file  Occupational History   Occupation: Airline pilot    Comment: resides in [place]  Tobacco Use   Smoking status: Former    Current packs/day: 0.50    Average packs/day: 0.5 packs/day for 50.0 years (25.0 ttl pk-yrs)    Types: Cigarettes   Smokeless tobacco: Former    Quit date: 06/19/2012  Substance and Sexual Activity   Alcohol  use: No    Alcohol /week: 0.0 standard drinks of alcohol    Drug use: No   Sexual activity: Not Currently  Other Topics Concern   Not on file  Social History Narrative   Patient is single and lives at [facility].    Patient does not have any children.   Patient is retired.   Patient has some college education.   Patient  is right-handed.   Patient drinks four cups of coffee daily, one cup of soda daily.   Social Drivers of Corporate investment banker Strain: Not on file  Food Insecurity: Not on file  Transportation Needs: Not on file  Physical Activity: Not on file  Stress: Not on file  Social Connections: Not on file  Intimate Partner Violence: Not on file      PHYSICAL EXAM  Vitals:   11/02/23 1117  BP: 117/69  Pulse: 94  Weight: 201 lb (91.2 kg)  Height: 5' 6 (1.676 m)     Body mass index is 32.44 kg/m.     11/02/2023   11:19 AM 10/20/2022    2:03 PM  10/22/2013    2:30 PM  MMSE - Mini Mental State Exam  Orientation to time 4 4 5    Orientation to Place 5 5 5    Registration 3 3 3    Attention/ Calculation 2 3 5    Recall 3 3 3    Language- name 2 objects 2 2 2    Language- repeat 1 1 1   Language- follow 3 step command 3 3 3    Language- read & follow direction 1 1 1    Write a sentence 1 1 1    Copy design 0 0 0   Total score 25 26 29       Data saved with a previous flowsheet row definition  Of a combination so where is normal applying a milligrams of crepitus time I would say that pain   Generalized: Well developed, in no acute distress, teeth in the front are chipped  Neurological examination  Mentation: Alert oriented to time, place, history taking. Follows all commands speech and language fluent Cranial nerve II-XII: Pupils were equal round reactive to light. Extraocular movements were full, visual field were full on confrontational test.  Head turning and shoulder shrug  were normal and symmetric.  Motor: The motor testing reveals 5 over 5 strength of all 4 extremities. Good symmetric motor tone is noted throughout.  Sensory: Sensory testing is intact to soft touch on all 4 extremities. No evidence of extinction is noted.  Coordination: Cerebellar testing reveals good finger-nose-finger and heel-to-shin bilaterally.  Gait and station: Gait is normal.    DIAGNOSTIC DATA (LABS, IMAGING, TESTING) - I reviewed patient records, labs, notes, testing and imaging myself where available.  Lab Results  Component Value Date   WBC 4.8 06/27/2023   HGB 10.7 (L) 06/27/2023   HCT 33.4 (L) 06/27/2023   MCV 99.4 06/27/2023   PLT 187 06/27/2023      Component Value Date/Time   NA 140 06/27/2023 1303   NA 139 04/25/2023 1548   K 4.3 06/27/2023 1303   CL 102 06/27/2023 1303   CO2 28 06/27/2023 1303   GLUCOSE 91 06/27/2023 1303   BUN 21 06/27/2023 1303   BUN 20 04/25/2023 1548   CREATININE 1.10 06/27/2023 1303   CALCIUM 10.2 06/27/2023  1303   PROT 7.3 06/27/2023 1303   PROT 7.1 04/25/2023 1548   ALBUMIN 4.1 06/27/2023 1303   ALBUMIN 4.3 04/25/2023 1548   AST 17 06/27/2023 1303   ALT 13 06/27/2023 1303   ALKPHOS 36 (L) 06/27/2023 1303   BILITOT <0.2 06/27/2023 1303   GFRNONAA >60 06/27/2023 1303   GFRAA 100 03/25/2020 1155      ASSESSMENT AND PLAN 78 y.o. 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 here with:  1.  Seizures 2.  Memory disturbance  --Continue Depakote  ER at 1000 mg daily-refilled today --Continue Vimpat  200 mg twice a day-refilled today --MMSE is stable continue to monitor --Advised if he has any seizure events he should let us  know  Follow-up in 6-8 months or sooner if needed.  Sister prefers that he follows up every 6 months instead of yearly.     Duwaine Russell, MSN, NP-C 11/02/2023, 11:34 AM [facility] [address], [address] [place], [place] [address] [phone]

## 2023-11-02 NOTE — Progress Notes (Signed)
 SABRA

## 2023-11-02 NOTE — Patient Instructions (Signed)
 Your Plan:  Continue Depakote  Continue Vimpat  Memory score stable     Thank you for coming to see us  at [facility]. I hope we have been able to provide you high quality care today.  You may receive a patient satisfaction survey over the next few weeks. We would appreciate your feedback and comments so that we may continue to improve ourselves and the health of our patients.

## 2023-12-27 NOTE — Progress Notes (Unsigned)
 [facility] [address] [place],  [place]  [address] [phone]  Clinic Day:  12/28/2023  Referring physician: Pia Kerney SQUIBB, MD   HISTORY OF PRESENT ILLNESS:  The patient is a 78 y.o. male with mild anemia.  As his hemoglobin has been consistently above 10, his anemia has been followed conservatively.  He comes in today to reassess his hemoglobin.  Since his last visit, the patient has been doing well.  He denies having increased fatigue or any overt forms of blood loss which concern him for progressive anemia.    PHYSICAL EXAM:  Blood pressure 135/65, pulse 93, temperature 98.7 F (37.1 C), temperature source Oral, resp. rate 16, height 5' 6 (1.676 m), weight 201 lb (91.2 kg), SpO2 98%. Wt Readings from Last 3 Encounters:  12/28/23 201 lb (91.2 kg)  11/02/23 201 lb (91.2 kg)  06/27/23 199 lb 1.6 oz (90.3 kg)   Body mass index is 32.44 kg/m. Performance status (ECOG): 1 - Symptomatic but completely ambulatory Physical Exam Constitutional:      Appearance: Normal appearance. He is not ill-appearing.  HENT:     Mouth/Throat:     Mouth: Mucous membranes are moist.     Pharynx: Oropharynx is clear. No oropharyngeal exudate or posterior oropharyngeal erythema.  Cardiovascular:     Rate and Rhythm: Normal rate and regular rhythm.     Heart sounds: No murmur heard.    No friction rub. No gallop.  Pulmonary:     Effort: Pulmonary effort is normal. No respiratory distress.     Breath sounds: Normal breath sounds. No wheezing, rhonchi or rales.  Abdominal:     General: Bowel sounds are normal. There is no distension.     Palpations: Abdomen is soft. There is no mass.     Tenderness: There is no abdominal tenderness.  Musculoskeletal:        General: No swelling.     Right lower leg: No edema.     Left lower leg: No edema.  Lymphadenopathy:     Cervical: No cervical adenopathy.     Upper Body:     Right upper body: No supraclavicular or  axillary adenopathy.     Left upper body: No supraclavicular or axillary adenopathy.     Lower Body: No right inguinal adenopathy. No left inguinal adenopathy.  Skin:    General: Skin is warm.     Coloration: Skin is not jaundiced.     Findings: No lesion or rash.  Neurological:     General: No focal deficit present.     Mental Status: He is alert and oriented to person, place, and time. Mental status is at baseline.  Psychiatric:        Mood and Affect: Mood normal.        Behavior: Behavior normal.        Thought Content: Thought content normal.    LABS:      Latest Ref Rng & Units 12/28/2023    1:26 PM 06/27/2023    1:03 PM 04/25/2023    3:48 PM  CBC  WBC 4.0 - 10.5 K/uL 4.3  4.8  4.7   Hemoglobin 13.0 - 17.0 g/dL 9.8  89.2  89.2   Hematocrit 39.0 - 52.0 % 30.8  33.4  33.7   Platelets 150 - 400 K/uL 177  187  185       Latest Ref Rng & Units 12/28/2023    1:26 PM 06/27/2023  1:03 PM 04/25/2023    3:48 PM  CMP  Glucose 70 - 99 mg/dL 888  91  86   BUN 8 - 23 mg/dL 19  21  20    Creatinine 0.61 - 1.24 mg/dL 8.97  8.89  8.85   Sodium 135 - 145 mmol/L 142  140  139   Potassium 3.5 - 5.1 mmol/L 4.2  4.3  4.6   Chloride 98 - 111 mmol/L 104  102  101   CO2 22 - 32 mmol/L 31  28  27    Calcium 8.9 - 10.3 mg/dL 9.1  89.7  9.6   Total Protein 6.5 - 8.1 g/dL 7.0  7.3  7.1   Total Bilirubin 0.0 - 1.2 mg/dL 0.2  <9.7  <9.7   Alkaline Phos 38 - 126 U/L 41  36  38   AST 15 - 41 U/L 18  17  15    ALT 0 - 44 U/L 13  13  14      Latest Reference Range & Units 12/28/23 13:26 12/28/23 13:27  Iron 45 - 182 ug/dL 54   UIBC ug/dL 658   TIBC 749 - 549 ug/dL 604   Saturation Ratios 17.9 - 39.5 % 14 (L)   Ferritin 24 - 336 ng/mL 804 (H)   Folate >5.9 ng/mL  9.8  Vitamin B12 180 - 914 pg/mL 301   (L): Data is abnormally low (H): Data is abnormally high  ASSESSMENT & PLAN:  Assessment/Plan:  A 78 y.o. male with mild anemia.  When evaluating his labs today, this gentleman's hemoglobin has  fallen since his last visit.  However, there appears to be no obvious evidence of a nutritional deficiency being present.  His iron parameters are more consistent with anemia of chronic disease more so than iron deficiency.  As the patient denies having any degree of fatigue, his anemia will be followed conservatively for now.  I will see him back in 3 months for repeat clinical assessment.  If his hemoglobin falls even lower at that time, the patient understands a bone marrow biopsy may need to be considered for further evaluation.  The patient understands all the plans discussed today and is in agreement with them.   Amal Renbarger DELENA Kerns, MD

## 2023-12-28 ENCOUNTER — Inpatient Hospital Stay: Attending: Oncology

## 2023-12-28 ENCOUNTER — Ambulatory Visit: Admitting: Oncology

## 2023-12-28 ENCOUNTER — Other Ambulatory Visit: Payer: Self-pay | Admitting: Oncology

## 2023-12-28 ENCOUNTER — Other Ambulatory Visit

## 2023-12-28 ENCOUNTER — Telehealth: Payer: Self-pay | Admitting: Oncology

## 2023-12-28 ENCOUNTER — Inpatient Hospital Stay: Admitting: Oncology

## 2023-12-28 VITALS — BP 135/65 | HR 93 | Temp 98.7°F | Resp 16 | Ht 66.0 in | Wt 201.0 lb

## 2023-12-28 DIAGNOSIS — D649 Anemia, unspecified: Secondary | ICD-10-CM

## 2023-12-28 LAB — CBC WITH DIFFERENTIAL (CANCER CENTER ONLY)
Abs Immature Granulocytes: 0.05 K/uL (ref 0.00–0.07)
Basophils Absolute: 0 K/uL (ref 0.0–0.1)
Basophils Relative: 1 %
Eosinophils Absolute: 0.1 K/uL (ref 0.0–0.5)
Eosinophils Relative: 1 %
HCT: 30.8 % — ABNORMAL LOW (ref 39.0–52.0)
Hemoglobin: 9.8 g/dL — ABNORMAL LOW (ref 13.0–17.0)
Immature Granulocytes: 1 %
Lymphocytes Relative: 35 %
Lymphs Abs: 1.5 K/uL (ref 0.7–4.0)
MCH: 32.2 pg (ref 26.0–34.0)
MCHC: 31.8 g/dL (ref 30.0–36.0)
MCV: 101.3 fL — ABNORMAL HIGH (ref 80.0–100.0)
Monocytes Absolute: 0.6 K/uL (ref 0.1–1.0)
Monocytes Relative: 14 %
Neutro Abs: 2.1 K/uL (ref 1.7–7.7)
Neutrophils Relative %: 48 %
Platelet Count: 177 K/uL (ref 150–400)
RBC: 3.04 MIL/uL — ABNORMAL LOW (ref 4.22–5.81)
RDW: 15.3 % (ref 11.5–15.5)
WBC Count: 4.3 K/uL (ref 4.0–10.5)
nRBC: 0 % (ref 0.0–0.2)

## 2023-12-28 LAB — IRON AND TIBC
Iron: 54 ug/dL (ref 45–182)
Saturation Ratios: 14 % — ABNORMAL LOW (ref 17.9–39.5)
TIBC: 395 ug/dL (ref 250–450)
UIBC: 341 ug/dL

## 2023-12-28 LAB — CMP (CANCER CENTER ONLY)
ALT: 13 U/L (ref 0–44)
AST: 18 U/L (ref 15–41)
Albumin: 3.8 g/dL (ref 3.5–5.0)
Alkaline Phosphatase: 41 U/L (ref 38–126)
Anion gap: 7 (ref 5–15)
BUN: 19 mg/dL (ref 8–23)
CO2: 31 mmol/L (ref 22–32)
Calcium: 9.1 mg/dL (ref 8.9–10.3)
Chloride: 104 mmol/L (ref 98–111)
Creatinine: 1.02 mg/dL (ref 0.61–1.24)
GFR, Estimated: >60 mL/min (ref >60)
Glucose, Bld: 111 mg/dL — ABNORMAL HIGH (ref 70–99)
Potassium: 4.2 mmol/L (ref 3.5–5.1)
Sodium: 142 mmol/L (ref 135–145)
Total Bilirubin: 0.2 mg/dL (ref 0.0–1.2)
Total Protein: 7 g/dL (ref 6.5–8.1)

## 2023-12-28 LAB — VITAMIN B12: Vitamin B-12: 301 pg/mL (ref 180–914)

## 2023-12-28 LAB — FERRITIN: Ferritin: 804 ng/mL — ABNORMAL HIGH (ref 24–336)

## 2023-12-28 LAB — FOLATE: Folate: 9.8 ng/mL (ref >5.9)

## 2023-12-28 NOTE — Telephone Encounter (Signed)
 Patient has been scheduled for follow-up visit per 12/28/23 LOS.  Pt aware of scheduled appt details.

## 2023-12-29 ENCOUNTER — Other Ambulatory Visit: Payer: Self-pay | Admitting: Oncology

## 2023-12-29 ENCOUNTER — Other Ambulatory Visit: Payer: Self-pay

## 2023-12-29 ENCOUNTER — Telehealth: Payer: Self-pay

## 2023-12-29 DIAGNOSIS — D649 Anemia, unspecified: Secondary | ICD-10-CM

## 2023-12-29 NOTE — Telephone Encounter (Signed)
 Latest Reference Range & Units 12/28/23 13:26 12/28/23 13:27  Iron 45 - 182 ug/dL 54   UIBC ug/dL 658   TIBC 749 - 549 ug/dL 604   Saturation Ratios 17.9 - 39.5 % 14 (L)   Ferritin 24 - 336 ng/mL 804 (H)   Folate >5.9 ng/mL  9.8  Vitamin B12 180 - 914 pg/mL 301   (L): Data is abnormally low (H): Data is abnormally high

## 2024-01-01 ENCOUNTER — Inpatient Hospital Stay

## 2024-01-05 ENCOUNTER — Inpatient Hospital Stay

## 2024-01-10 ENCOUNTER — Telehealth: Payer: Self-pay | Admitting: *Deleted

## 2024-01-10 DIAGNOSIS — I509 Heart failure, unspecified: Secondary | ICD-10-CM

## 2024-01-10 DIAGNOSIS — I5043 Acute on chronic combined systolic (congestive) and diastolic (congestive) heart failure: Secondary | ICD-10-CM

## 2024-01-10 NOTE — Telephone Encounter (Signed)
 Fax form [facility] to refer pt for Echocardiogram by Dr. Ronal Mulch for heart failure

## 2024-02-13 ENCOUNTER — Ambulatory Visit: Attending: Cardiology

## 2024-02-13 DIAGNOSIS — I509 Heart failure, unspecified: Secondary | ICD-10-CM | POA: Insufficient documentation

## 2024-02-14 LAB — ECHOCARDIOGRAM COMPLETE
Area-P 1/2: 4.29 cm2
S' Lateral: 3.3 cm

## 2024-02-16 ENCOUNTER — Ambulatory Visit: Payer: Self-pay | Admitting: Cardiology

## 2024-03-29 ENCOUNTER — Inpatient Hospital Stay

## 2024-03-29 ENCOUNTER — Inpatient Hospital Stay: Attending: Oncology | Admitting: Oncology

## 2024-05-28 ENCOUNTER — Ambulatory Visit: Admitting: Neurology

## 2024-12-05 ENCOUNTER — Ambulatory Visit: Admitting: Adult Health
